# Patient Record
Sex: Female | Born: 1935 | Race: White | Hispanic: No | Smoking: Never smoker
Health system: Southern US, Community
[De-identification: ages and names within clinical notes are randomized; demographics above are authoritative.]

## PROBLEM LIST (undated history)

## (undated) DIAGNOSIS — G919 Hydrocephalus, unspecified: Secondary | ICD-10-CM

## (undated) DIAGNOSIS — G309 Alzheimer's disease, unspecified: Secondary | ICD-10-CM

## (undated) DIAGNOSIS — F0280 Dementia in other diseases classified elsewhere without behavioral disturbance: Secondary | ICD-10-CM

---

## 2007-03-12 ENCOUNTER — Ambulatory Visit (HOSPITAL_COMMUNITY): Admission: RE | Admit: 2007-03-12 | Discharge: 2007-03-12 | Payer: Self-pay | Admitting: Pulmonary Disease

## 2009-06-19 ENCOUNTER — Emergency Department (HOSPITAL_COMMUNITY): Admission: EM | Admit: 2009-06-19 | Discharge: 2009-06-19 | Payer: Self-pay | Admitting: Emergency Medicine

## 2009-06-19 ENCOUNTER — Encounter: Payer: Self-pay | Admitting: Orthopedic Surgery

## 2009-06-23 ENCOUNTER — Ambulatory Visit: Payer: Self-pay | Admitting: Orthopedic Surgery

## 2009-06-23 DIAGNOSIS — S92309A Fracture of unspecified metatarsal bone(s), unspecified foot, initial encounter for closed fracture: Secondary | ICD-10-CM | POA: Insufficient documentation

## 2009-07-25 ENCOUNTER — Ambulatory Visit: Payer: Self-pay | Admitting: Orthopedic Surgery

## 2010-08-15 ENCOUNTER — Encounter: Payer: Self-pay | Admitting: Orthopedic Surgery

## 2010-11-01 ENCOUNTER — Encounter: Payer: Self-pay | Admitting: Orthopedic Surgery

## 2010-11-09 ENCOUNTER — Encounter
Admission: RE | Admit: 2010-11-09 | Discharge: 2010-11-14 | Payer: Self-pay | Source: Home / Self Care | Attending: Endocrinology | Admitting: Endocrinology

## 2010-11-14 NOTE — Letter (Signed)
Summary: [facility] medical record request  [facility] medical record request   Imported By: Jacklynn Ganong 09/14/2010 13:57:24  _____________________________________________________________________  External Attachment:    Type:   Image     Comment:   External Document

## 2010-12-06 NOTE — Letter (Signed)
Summary: Medical record request [facility]  Medical record request [facility]   Imported By: Cammie Sickle 11/28/2010 20:50:29  _____________________________________________________________________  External Attachment:    Type:   Image     Comment:   External Document

## 2012-06-22 ENCOUNTER — Emergency Department (HOSPITAL_COMMUNITY)
Admission: EM | Admit: 2012-06-22 | Discharge: 2012-06-22 | Disposition: A | Discharge status: Home / Self Care | Payer: Medicare Other | Attending: Emergency Medicine | Admitting: Emergency Medicine

## 2012-06-22 ENCOUNTER — Encounter (HOSPITAL_COMMUNITY): Payer: Self-pay | Admitting: *Deleted

## 2012-06-22 ENCOUNTER — Emergency Department (HOSPITAL_COMMUNITY): Payer: Medicare Other

## 2012-06-22 DIAGNOSIS — S9001XA Contusion of right ankle, initial encounter: Secondary | ICD-10-CM

## 2012-06-22 DIAGNOSIS — W19XXXA Unspecified fall, initial encounter: Secondary | ICD-10-CM | POA: Insufficient documentation

## 2012-06-22 DIAGNOSIS — Y9229 Other specified public building as the place of occurrence of the external cause: Secondary | ICD-10-CM | POA: Insufficient documentation

## 2012-06-22 DIAGNOSIS — Z794 Long term (current) use of insulin: Secondary | ICD-10-CM | POA: Insufficient documentation

## 2012-06-22 DIAGNOSIS — S9000XA Contusion of unspecified ankle, initial encounter: Secondary | ICD-10-CM | POA: Insufficient documentation

## 2012-06-22 DIAGNOSIS — S60222A Contusion of left hand, initial encounter: Secondary | ICD-10-CM

## 2012-06-22 DIAGNOSIS — IMO0002 Reserved for concepts with insufficient information to code with codable children: Secondary | ICD-10-CM | POA: Insufficient documentation

## 2012-06-22 DIAGNOSIS — S0001XA Abrasion of scalp, initial encounter: Secondary | ICD-10-CM

## 2012-06-22 DIAGNOSIS — S60229A Contusion of unspecified hand, initial encounter: Secondary | ICD-10-CM | POA: Insufficient documentation

## 2012-06-22 DIAGNOSIS — S0990XA Unspecified injury of head, initial encounter: Secondary | ICD-10-CM | POA: Insufficient documentation

## 2012-06-22 DIAGNOSIS — E119 Type 2 diabetes mellitus without complications: Secondary | ICD-10-CM | POA: Insufficient documentation

## 2012-06-22 LAB — GLUCOSE, CAPILLARY: Glucose-Capillary: 332 mg/dL — ABNORMAL HIGH (ref 70–99)

## 2012-06-22 MED ORDER — ACETAMINOPHEN 500 MG PO TABS
1000.0000 mg | ORAL_TABLET | Freq: Once | ORAL | Status: AC
  Administered 2012-06-22: 1000 mg via ORAL
  Filled 2012-06-22: qty 2

## 2012-06-22 NOTE — ED Provider Notes (Signed)
History    CSN: [id number]  Arrival date & time 06/22/12  1237   First MD Initiated Contact with Patient 06/22/12 1307      Chief Complaint  Patient presents with  . Fall     The history is provided by the patient.   Jillian Hart is a 76 y.o. female who presents to the Emergency Department complaining of backward fall hitting on the pavement. Pt also c/o laceration to the back of the head, bleeding is controlled currently.  She reports she tripped on the church steps and fell backwards.  She's complaining of pain to her right hand in her right ankle.  She reports mild headache for which she requests only Tylenol.  She wants a stronger pain medication.  She denies neck pain.  She has no weakness of her upper lower extremities.  He reports is she's been having some difficulty keeping her blood sugars controlled lately and she is currently working with her primary care physician regarding this.  She reports pain to her Ace of her right thumb as well as her right medial ankle which she states she injured during the fall.  She has no other complaints.  Her pain is mild in severity this time.  Family reports she is at baseline mental status.  She is not on anticoagulants.   Past Medical History  Diagnosis Date  . Diabetes mellitus     History reviewed. No pertinent past surgical history.  No family history on file.  History  Substance Use Topics  . Smoking status: Never Smoker   . Smokeless tobacco: Not on file  . Alcohol Use: No    No OB history provided  Review of Systems  All other systems reviewed and are negative.    A complete 10 system review of systems was obtained and all systems are negative except as noted in the HPI and PMH.   Allergies  Review of patient's allergies indicates no known allergies.  Home Medications   Current Outpatient Rx  Name Route Sig Dispense Refill  . INSULIN ASPART 100 UNIT/ML [place] SOLN Subcutaneous Inject 3 Units into the skin 3 (three)  times daily before meals.    . INSULIN GLARGINE 100 UNIT/ML [place] SOLN Subcutaneous Inject 80-90 Units into the skin at bedtime.    Marland Kitchen LISINOPRIL 10 MG PO TABS Oral Take 10 mg by mouth daily.    Marland Kitchen PAROXETINE HCL 40 MG PO TABS Oral Take 40 mg by mouth at bedtime.    Marland Kitchen ROSUVASTATIN CALCIUM 20 MG PO TABS Oral Take 20 mg by mouth daily.      Triage vitals: BP 156/103  Pulse 82  Temp 98.4 F (36.9 C)  Resp 20  Ht 4\' 10"  (1.473 m)  Wt 122 lb (55.339 kg)  BMI 25.50 kg/m2  SpO2 98%  Physical Exam  Nursing note and vitals reviewed. Constitutional: She is oriented to person, place, and time. She appears well-developed and well-nourished. No distress.  HENT:  Head: Normocephalic and atraumatic.       Small abrasion without laceration but does have associated hematoma to her posterior scalp.  This area is approximately the size of a quarter.  No active bleeding.  Eyes: EOM are normal.  Neck: Normal range of motion.       C-spine nontender.  C-spine cleared by Nexus criteria.  No cervical spine step-offs.  Cardiovascular: Normal rate, regular rhythm and normal heart sounds.   Pulmonary/Chest: Effort normal and breath sounds normal.  Abdominal:  Soft. She exhibits no distension. There is no tenderness.  Musculoskeletal: Normal range of motion.  Neurological: She is alert and oriented to person, place, and time.  Skin: Skin is warm and dry.  Psychiatric: She has a normal mood and affect. Judgment normal.    ED Course  Procedures (including critical care time)  DIAGNOSTIC STUDIES: Oxygen Saturation is 98% on on room air, normal by my interpretation.    COORDINATION OF CARE:    Labs Reviewed  GLUCOSE, CAPILLARY - Abnormal; Notable for the following:    Glucose-Capillary 332 (*)     All other components within normal limits   Dg Ankle Complete Right  06/22/2012  *RADIOLOGY REPORT*  Clinical Data: Fall, right hand and ankle pain  RIGHT ANKLE - COMPLETE 3+ VIEW  Comparison: Prior radiographs  the right ankle 06/19/2009  Findings: No acute fracture, malalignment or ankle joint effusion. The ankle mortise is congruent.  There is mild soft tissue swelling anteriorly.  Healed fracture of the base of the fifth manner tarsal.  IMPRESSION: No acute fracture, malalignment or ankle joint effusion.   Original Report Authenticated By: Threasa Beards Head Wo Contrast  06/22/2012  *RADIOLOGY REPORT*  Clinical Data: 76 year old female status post fall with posterior head injury.  CT HEAD WITHOUT CONTRAST  Technique:  Contiguous axial images were obtained from the base of the skull through the vertex without contrast.  Comparison: None.  Findings: Broad-based posterior superficial scalp hematoma measuring up to 14 mm in thickness.  Underlying calvarium intact.  Mastoids are clear.  Minimal bubbly opacity in the right sphenoid sinus.  Hyperostosis of the calvarium.  Orbit and other scalp soft tissues are within normal limits.  Calcified atherosclerosis at the skull base.  There is ventriculomegaly which seems out of proportion to the degree of volume loss  , but the temporal horns remain relatively diminutive and there is no evidence of transependymal edema. No midline shift, mass effect, or evidence of mass lesion.  No acute intracranial hemorrhage identified.  Evidence of a chronic lacunar infarct in the anterior right thalamus. No evidence of cortically based acute infarction identified.  Gray-white matter differentiation elsewhere within normal limits for age.  IMPRESSION: 1.  Posterior scalp hematoma without underlying fracture. 2.  No acute traumatic injury to the brain. 3.  Ventriculomegaly, nonspecific but in the appropriate clinical setting NPH could not be excluded. 4.  Mild chronic small vessel ischemia suspected.   Original Report Authenticated By: Harley Hallmark, M.D.    Dg Hand Complete Right  06/22/2012  *RADIOLOGY REPORT*  Clinical Data: Fall, right hand and ankle pain  RIGHT HAND - COMPLETE 3+ VIEW   Comparison: None.  Findings: No acute fracture, malalignment or focal soft tissue swelling.  Moderately advanced degenerative osteoarthritis of the from carpometacarpal and interphalangeal joints.  Additionally, there is more mild degenerative osteoarthritic change involving the distal interphalangeal joints of the index, ring and little fingers.  The long finger is relatively spared.  The carpus is intact.  IMPRESSION:  1.  No acute fracture, or malalignment. 2.  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   Original Report Authenticated By: Vilma Prader     I personally reviewed the imaging tests through PACS system I reviewed available ER/hospitalization records thought the EMR    1. Minor head injury   2. Abrasion of scalp   3. Contusion of left hand   4. Contusion of right ankle  MDM  Is a mechanical fall today.  She is hyperglycemic but has no other secondary signs of diabetic ketoacidosis.  Her fall sounds very mechanical.  CT of her head was obtained which demonstrates no fracture.  There was noted to ventriculomegaly have asked that the patient followup with her PCP as well as the neurologist.          Lyanne Co, MD 06/22/12 [phone]

## 2012-06-22 NOTE — ED Notes (Signed)
Pt was coming out of church, fell backwards hitting on the pavement, unsure of what caused her to fall, denies any dizziness, c/o laceration to back of head, bleeding controlled at triage.

## 2012-07-09 ENCOUNTER — Other Ambulatory Visit: Payer: Self-pay | Admitting: Neurosurgery

## 2012-07-09 DIAGNOSIS — R413 Other amnesia: Secondary | ICD-10-CM

## 2012-07-09 DIAGNOSIS — R269 Unspecified abnormalities of gait and mobility: Secondary | ICD-10-CM

## 2012-07-09 DIAGNOSIS — G910 Communicating hydrocephalus: Secondary | ICD-10-CM

## 2012-07-13 ENCOUNTER — Ambulatory Visit
Admission: RE | Admit: 2012-07-13 | Discharge: 2012-07-13 | Disposition: A | Discharge status: Home / Self Care | Payer: Medicare Other | Source: Ambulatory Visit | Attending: Neurosurgery | Admitting: Neurosurgery

## 2012-07-13 DIAGNOSIS — R413 Other amnesia: Secondary | ICD-10-CM

## 2012-07-13 DIAGNOSIS — R269 Unspecified abnormalities of gait and mobility: Secondary | ICD-10-CM

## 2012-07-13 DIAGNOSIS — G910 Communicating hydrocephalus: Secondary | ICD-10-CM

## 2012-07-18 ENCOUNTER — Other Ambulatory Visit: Payer: Self-pay | Admitting: Neurology

## 2012-07-18 DIAGNOSIS — R269 Unspecified abnormalities of gait and mobility: Secondary | ICD-10-CM

## 2012-07-18 DIAGNOSIS — R413 Other amnesia: Secondary | ICD-10-CM

## 2012-07-18 DIAGNOSIS — G910 Communicating hydrocephalus: Secondary | ICD-10-CM

## 2012-07-18 DIAGNOSIS — D649 Anemia, unspecified: Secondary | ICD-10-CM

## 2012-07-22 ENCOUNTER — Ambulatory Visit
Admission: RE | Admit: 2012-07-22 | Discharge: 2012-07-22 | Disposition: A | Discharge status: Home / Self Care | Payer: Medicare Other | Source: Ambulatory Visit | Attending: Neurology | Admitting: Neurology

## 2012-07-22 VITALS — BP 112/64 | HR 77

## 2012-07-22 DIAGNOSIS — R269 Unspecified abnormalities of gait and mobility: Secondary | ICD-10-CM

## 2012-07-22 DIAGNOSIS — G932 Benign intracranial hypertension: Secondary | ICD-10-CM

## 2012-07-22 DIAGNOSIS — R413 Other amnesia: Secondary | ICD-10-CM

## 2012-07-22 DIAGNOSIS — D649 Anemia, unspecified: Secondary | ICD-10-CM

## 2012-07-22 DIAGNOSIS — G910 Communicating hydrocephalus: Secondary | ICD-10-CM

## 2012-07-22 LAB — CSF CELL COUNT WITH DIFFERENTIAL: Tube #: 3

## 2012-07-22 LAB — CRYPTOCOCCAL ANTIGEN, CSF: Crypto Ag: NEGATIVE

## 2012-07-22 LAB — GLUCOSE, CSF: Glucose, CSF: 95 mg/dL — ABNORMAL HIGH (ref 43–76)

## 2012-07-23 LAB — ANGIOTENSIN CONVERTING ENZYME, CSF: ACE, CSF: 6 U/L (ref <=15)

## 2012-07-23 LAB — VDRL, CSF

## 2012-11-02 ENCOUNTER — Emergency Department (HOSPITAL_COMMUNITY): Payer: Medicare Other

## 2012-11-02 ENCOUNTER — Encounter (HOSPITAL_COMMUNITY): Payer: Self-pay | Admitting: *Deleted

## 2012-11-02 ENCOUNTER — Emergency Department (HOSPITAL_COMMUNITY)
Admission: EM | Admit: 2012-11-02 | Discharge: 2012-11-02 | Disposition: A | Discharge status: Home / Self Care | Payer: Medicare Other | Attending: Emergency Medicine | Admitting: Emergency Medicine

## 2012-11-02 DIAGNOSIS — J3489 Other specified disorders of nose and nasal sinuses: Secondary | ICD-10-CM | POA: Insufficient documentation

## 2012-11-02 DIAGNOSIS — R05 Cough: Secondary | ICD-10-CM | POA: Insufficient documentation

## 2012-11-02 DIAGNOSIS — Z794 Long term (current) use of insulin: Secondary | ICD-10-CM | POA: Insufficient documentation

## 2012-11-02 DIAGNOSIS — E119 Type 2 diabetes mellitus without complications: Secondary | ICD-10-CM | POA: Insufficient documentation

## 2012-11-02 DIAGNOSIS — J029 Acute pharyngitis, unspecified: Secondary | ICD-10-CM | POA: Insufficient documentation

## 2012-11-02 DIAGNOSIS — R51 Headache: Secondary | ICD-10-CM | POA: Insufficient documentation

## 2012-11-02 DIAGNOSIS — Z8669 Personal history of other diseases of the nervous system and sense organs: Secondary | ICD-10-CM | POA: Insufficient documentation

## 2012-11-02 DIAGNOSIS — R059 Cough, unspecified: Secondary | ICD-10-CM

## 2012-11-02 DIAGNOSIS — J069 Acute upper respiratory infection, unspecified: Secondary | ICD-10-CM

## 2012-11-02 DIAGNOSIS — Z79899 Other long term (current) drug therapy: Secondary | ICD-10-CM | POA: Insufficient documentation

## 2012-11-02 HISTORY — DX: Hydrocephalus, unspecified: G91.9

## 2012-11-02 MED ORDER — BENZONATATE 200 MG PO CAPS: 200.0000 mg | ORAL_CAPSULE | Freq: Three times a day (TID) | ORAL | Status: DC | PRN

## 2012-11-02 MED ORDER — AZITHROMYCIN 250 MG PO TABS: ORAL_TABLET | ORAL | Status: DC

## 2012-11-02 NOTE — ED Notes (Signed)
Headache, sore throat, cough, congestion x 1 wk.

## 2012-11-03 NOTE — ED Provider Notes (Signed)
History     CSN: [id number]  Arrival date & time 11/02/12  7829   First MD Initiated Contact with Patient 11/02/12 1018      Chief Complaint  Patient presents with  . Cough  . Sore Throat    (Consider location/radiation/quality/duration/timing/severity/associated sxs/prior treatment) HPI Comments: patient c/o nasal congestion frontal headache, cough and sore throat for one week.  States she has been taking tylenol and ibuprofen w/o improvement.  States the headache has been gradual in onset and cough is mostly non-productive.  She denies fever, chest pain, neck pain or stiffness, shortness of breath or vomiting.  Cough is mostly worse at night.  Patient is a 77 y.o. female presenting with URI. The history is provided by the patient.  URI The primary symptoms include headaches, sore throat and cough. Primary symptoms do not include fever, ear pain, swollen glands, wheezing, abdominal pain, nausea, vomiting, myalgias, arthralgias or rash. Primary symptoms comment: nasal congestion The current episode started 6 to 7 days ago. This is a new problem. The problem has not changed since onset. The headache began more than 2 days ago. The headache developed gradually. Headache is a new problem. The headache is present intermittently. The headache is not associated with aura, photophobia, double vision, eye pain, decreased vision, stiff neck, neck stiffness, weakness or loss of balance.   The sore throat began more than 2 days ago. The sore throat has been unchanged since its onset. The sore throat is mild in intensity. Pain radiation: does not radiate. The sore throat is not accompanied by trouble swallowing, drooling, hoarse voice or stridor.  The cough began 6 to 7 days ago. The cough is new. The cough is productive. The sputum is clear.  Symptoms associated with the illness include congestion and rhinorrhea. The illness is not associated with chills, facial pain or sinus pressure.    Past  Medical History  Diagnosis Date  . Diabetes mellitus   . Hydrocephalus     History reviewed. No pertinent past surgical history.  No family history on file.  History  Substance Use Topics  . Smoking status: Never Smoker   . Smokeless tobacco: Not on file  . Alcohol Use: No    OB History    Grav Para Term Preterm Abortions TAB SAB Ect Mult Living                  Review of Systems  Constitutional: Negative for fever, chills, activity change and appetite change.  HENT: Positive for congestion, sore throat and rhinorrhea. Negative for ear pain, hoarse voice, facial swelling, drooling, trouble swallowing, neck pain, neck stiffness and sinus pressure.   Eyes: Negative for double vision, photophobia, pain and visual disturbance.  Respiratory: Positive for cough. Negative for shortness of breath, wheezing and stridor.   Gastrointestinal: Negative for nausea, vomiting and abdominal pain.  Genitourinary: Negative for dysuria, urgency, flank pain and difficulty urinating.  Musculoskeletal: Negative for myalgias and arthralgias.  Skin: Negative.  Negative for rash and wound.  Neurological: Positive for headaches. Negative for dizziness, weakness, numbness and loss of balance.  Hematological: Negative for adenopathy.  Psychiatric/Behavioral: Negative for confusion and decreased concentration.  All other systems reviewed and are negative.    Allergies  Review of patient's allergies indicates no known allergies.  Home Medications   Current Outpatient Rx  Name  Route  Sig  Dispense  Refill  . ACETAMINOPHEN 325 MG PO TABS   Oral   Take 325 mg by mouth  every 6 (six) hours as needed. Pain.         Marland Kitchen CETIRIZINE HCL 10 MG PO TABS   Oral   Take 10 mg by mouth daily.         . INSULIN GLARGINE 100 UNIT/ML [place] SOLN   Subcutaneous   Inject 100 Units into the skin at bedtime.          Marland Kitchen LISINOPRIL 10 MG PO TABS   Oral   Take 10 mg by mouth daily.         Marland Kitchen PAROXETINE HCL  40 MG PO TABS   Oral   Take 40 mg by mouth at bedtime.         Marland Kitchen ROSUVASTATIN CALCIUM 20 MG PO TABS   Oral   Take 20 mg by mouth daily.         . AZITHROMYCIN 250 MG PO TABS      Take two tablets on day one, then one tab qd days 2-5   6 tablet   0   . BENZONATATE 200 MG PO CAPS   Oral   Take 1 capsule (200 mg total) by mouth 3 (three) times daily as needed for cough.   21 capsule   0     BP 115/67  Pulse 96  Temp 97.7 F (36.5 C) (Oral)  Resp 16  Ht 4\' 9"  (1.448 m)  Wt 115 lb (52.164 kg)  BMI 24.89 kg/m2  SpO2 97%  Physical Exam  Nursing note and vitals reviewed. Constitutional: She is oriented to person, place, and time. She appears well-developed and well-nourished. No distress.  HENT:  Head: Normocephalic and atraumatic. No trismus in the jaw.  Right Ear: Tympanic membrane and ear canal normal.  Left Ear: Tympanic membrane and ear canal normal.  Nose: Mucosal edema and rhinorrhea present. Right sinus exhibits frontal sinus tenderness. Right sinus exhibits no maxillary sinus tenderness. Left sinus exhibits frontal sinus tenderness. Left sinus exhibits no maxillary sinus tenderness.  Mouth/Throat: Uvula is midline and mucous membranes are normal. No uvula swelling. Posterior oropharyngeal erythema present. No oropharyngeal exudate, posterior oropharyngeal edema or tonsillar abscesses.  Neck: Normal range of motion and phonation normal. Neck supple. No Brudzinski's sign and no Kernig's sign noted.  Cardiovascular: Normal rate, regular rhythm, normal heart sounds and intact distal pulses.   No murmur heard. Pulmonary/Chest: Effort normal and breath sounds normal. No respiratory distress. She has no wheezes. She has no rales.  Abdominal: Soft. Bowel sounds are normal.  Musculoskeletal: She exhibits no edema.  Lymphadenopathy:    She has no cervical adenopathy.  Neurological: She is alert and oriented to person, place, and time. She exhibits normal muscle tone.  Coordination normal.  Skin: Skin is warm and dry.    ED Course  Procedures (including critical care time)  Results for orders placed during the hospital encounter of 11/02/12  RAPID STREP SCREEN      Component Value Range   Streptococcus, Group A Screen (Direct) NEGATIVE  NEGATIVE    Dg Chest 2 View  11/02/2012  *RADIOLOGY REPORT*  Clinical Data: Cough and congestion  CHEST - 2 VIEW  Comparison: None.  Findings: Mild right hemidiaphragm elevation. Midline trachea. Borderline cardiomegaly.  Atherosclerosis in the transverse aorta. No pleural effusion or pneumothorax.  Mild volume loss at the left lung base.  IMPRESSION: No acute cardiopulmonary disease.   Original Report Authenticated By: Jeronimo Greaves, M.D.      1. Cough   2. URI (upper respiratory  infection)       MDM   Patient is well appearing, NAD. Vitals stable.  No tachycardia, tachypnea or hypoxia.  No PNA on CXR.    Will treat with tessalon and Z-pack.  Pt hx discussed with EDP.  Patient agrees to close f/u with PMD.        Noreen Mackintosh L. [place], [place] 11/03/12 2006

## 2012-11-04 NOTE — ED Provider Notes (Signed)
Medical screening examination/treatment/procedure(s) were performed by non-physician practitioner and as supervising physician I was immediately available for consultation/collaboration.   Yarixa Lightcap W Mohammad Granade, MD 11/04/12 1616 

## 2013-02-06 DIAGNOSIS — Z0289 Encounter for other administrative examinations: Secondary | ICD-10-CM

## 2013-02-25 ENCOUNTER — Emergency Department (HOSPITAL_COMMUNITY): Payer: Medicare Other

## 2013-02-25 ENCOUNTER — Encounter (HOSPITAL_COMMUNITY): Payer: Self-pay | Admitting: Emergency Medicine

## 2013-02-25 ENCOUNTER — Emergency Department (HOSPITAL_COMMUNITY)
Admission: EM | Admit: 2013-02-25 | Discharge: 2013-02-25 | Disposition: A | Discharge status: Home / Self Care | Payer: Medicare Other | Attending: Emergency Medicine | Admitting: Emergency Medicine

## 2013-02-25 DIAGNOSIS — G911 Obstructive hydrocephalus: Secondary | ICD-10-CM | POA: Insufficient documentation

## 2013-02-25 DIAGNOSIS — E119 Type 2 diabetes mellitus without complications: Secondary | ICD-10-CM | POA: Insufficient documentation

## 2013-02-25 DIAGNOSIS — T148XXA Other injury of unspecified body region, initial encounter: Secondary | ICD-10-CM

## 2013-02-25 DIAGNOSIS — Z794 Long term (current) use of insulin: Secondary | ICD-10-CM | POA: Insufficient documentation

## 2013-02-25 DIAGNOSIS — Y9301 Activity, walking, marching and hiking: Secondary | ICD-10-CM | POA: Insufficient documentation

## 2013-02-25 DIAGNOSIS — Z79899 Other long term (current) drug therapy: Secondary | ICD-10-CM | POA: Insufficient documentation

## 2013-02-25 DIAGNOSIS — S7000XA Contusion of unspecified hip, initial encounter: Secondary | ICD-10-CM | POA: Insufficient documentation

## 2013-02-25 DIAGNOSIS — W010XXA Fall on same level from slipping, tripping and stumbling without subsequent striking against object, initial encounter: Secondary | ICD-10-CM | POA: Insufficient documentation

## 2013-02-25 DIAGNOSIS — Y92009 Unspecified place in unspecified non-institutional (private) residence as the place of occurrence of the external cause: Secondary | ICD-10-CM | POA: Insufficient documentation

## 2013-02-25 MED ORDER — TRAMADOL HCL 50 MG PO TABS: 50.0000 mg | ORAL_TABLET | Freq: Four times a day (QID) | ORAL | Status: AC | PRN

## 2013-02-25 NOTE — ED Notes (Signed)
Discharge instructions given and reviewed with patient.  Prescription given for Tramadol; effects and use explained.  Patient verbalized understanding to rest her hip for the next few days and to follow up with PMD as needed.  Patient discharged home in good condition.

## 2013-02-25 NOTE — ED Provider Notes (Signed)
History     CSN: [id number]  Arrival date & time 02/25/13  1946   First MD Initiated Contact with Patient 02/25/13 1953      Chief Complaint  Patient presents with  . Fall  . Hip Pain    (Consider location/radiation/quality/duration/timing/severity/associated sxs/prior treatment) HPI Comments: Patient comes to the ER for evaluation after a fall. Patient reports that she walked up several steps onto a porch and lost her balance. She fell onto her left side, did not fall down the steps. She landed on the porch. She was able to get up and ambulate after the fall. She is complaining of pain in the lateral hip that is moderate and worse with movement. No head injury. No neck or back pain.  Patient is a 77 y.o. female presenting with fall and hip pain.  Fall  Hip Pain    Past Medical History  Diagnosis Date  . Diabetes mellitus   . Hydrocephalus     History reviewed. No pertinent past surgical history.  History reviewed. No pertinent family history.  History  Substance Use Topics  . Smoking status: Never Smoker   . Smokeless tobacco: Not on file  . Alcohol Use: No    OB History   Grav Para Term Preterm Abortions TAB SAB Ect Mult Living                  Review of Systems  HENT: Negative for neck pain.   Musculoskeletal: Negative for back pain.    Allergies  Review of patient's allergies indicates no known allergies.  Home Medications   Current Outpatient Rx  Name  Route  Sig  Dispense  Refill  . acetaminophen (TYLENOL) 325 MG tablet   Oral   Take 325 mg by mouth every 6 (six) hours as needed. Pain.         Marland Kitchen azithromycin (ZITHROMAX Z-PAK) 250 MG tablet      Take two tablets on day one, then one tab qd days 2-5   6 tablet   0   . benzonatate (TESSALON) 200 MG capsule   Oral   Take 1 capsule (200 mg total) by mouth 3 (three) times daily as needed for cough.   21 capsule   0   . cetirizine (ZYRTEC) 10 MG tablet   Oral   Take 10 mg by mouth  daily.         . insulin glargine (LANTUS) 100 UNIT/ML injection   Subcutaneous   Inject 100 Units into the skin at bedtime.          Marland Kitchen lisinopril (PRINIVIL,ZESTRIL) 10 MG tablet   Oral   Take 10 mg by mouth daily.         Marland Kitchen PARoxetine (PAXIL) 40 MG tablet   Oral   Take 40 mg by mouth at bedtime.         . rosuvastatin (CRESTOR) 20 MG tablet   Oral   Take 20 mg by mouth daily.           BP 138/74  Pulse 95  Temp(Src) 97.1 F (36.2 C) (Oral)  Resp 16  Ht 4\' 10"  (1.473 m)  Wt 112 lb (50.803 kg)  BMI 23.41 kg/m2  SpO2 100%  Physical Exam  Constitutional: She is oriented to person, place, and time. She appears well-developed and well-nourished. No distress.  HENT:  Head: Normocephalic and atraumatic.  Right Ear: Hearing normal.  Left Ear: Hearing normal.  Nose: Nose normal.  Mouth/Throat: Oropharynx  is clear and moist and mucous membranes are normal.  Eyes: Conjunctivae and EOM are normal. Pupils are equal, round, and reactive to light.  Neck: Normal range of motion. Neck supple.  Cardiovascular: Regular rhythm, S1 normal and S2 normal.  Exam reveals no gallop and no friction rub.   No murmur heard. Pulmonary/Chest: Effort normal and breath sounds normal. No respiratory distress. She exhibits no tenderness.  Abdominal: Soft. Normal appearance and bowel sounds are normal. There is no hepatosplenomegaly. There is no tenderness. There is no rebound, no guarding, no tenderness at McBurney's point and negative Murphy's sign. No hernia.  Musculoskeletal: Normal range of motion.       Left hip: She exhibits tenderness. She exhibits normal range of motion, normal strength and no deformity.       Legs: Neurological: She is alert and oriented to person, place, and time. She has normal strength. No cranial nerve deficit or sensory deficit. Coordination normal. GCS eye subscore is 4. GCS verbal subscore is 5. GCS motor subscore is 6.  Skin: Skin is warm, dry and intact. No  rash noted. No cyanosis.  Psychiatric: She has a normal mood and affect. Her speech is normal and behavior is normal. Thought content normal.    ED Course  Procedures (including critical care time)  Labs Reviewed - No data to display Dg Hip Complete Left  02/25/2013   *RADIOLOGY REPORT*  Clinical Data: Fall and left hip pain.  LEFT HIP - COMPLETE 2+ VIEW  Comparison: None.  Findings: Pelvic bony ring is intact.  Large amount of stool in the left lower quadrant.  Left hip is located without acute fracture. Degenerative changes in the lower lumbar spine.  IMPRESSION: No acute bony abnormality.  If there is high clinical concern for an occult hip fracture, recommend further characterization with CT or MRI.   Original Report Authenticated By: Richarda Overlie, M.D.     Diagnosis: Contusion    MDM  Presents with complaints of pain in the lateral aspect of the hip after a ground-level fall. There is no other injury. Patient is ambulatory. There is low suspicion for hip fracture, x-ray obtained to look for fracture as well as pelvic fracture. X-ray was unremarkable. Patient reassured, rest and pain control.        Gilda Crease, MD 02/25/13 2100

## 2013-02-25 NOTE — ED Notes (Signed)
Pt states she fell going down the steps and c/o left hip pain.

## 2014-06-08 ENCOUNTER — Ambulatory Visit: Payer: Medicare Other | Admitting: Physical Therapy

## 2014-11-29 ENCOUNTER — Encounter (HOSPITAL_COMMUNITY): Payer: Self-pay | Admitting: Emergency Medicine

## 2014-11-29 ENCOUNTER — Emergency Department (HOSPITAL_COMMUNITY): Payer: Medicare Other

## 2014-11-29 ENCOUNTER — Emergency Department (HOSPITAL_COMMUNITY)
Admission: EM | Admit: 2014-11-29 | Discharge: 2014-11-29 | Disposition: A | Discharge status: Home / Self Care | Payer: Medicare Other | Attending: Emergency Medicine | Admitting: Emergency Medicine

## 2014-11-29 DIAGNOSIS — E119 Type 2 diabetes mellitus without complications: Secondary | ICD-10-CM | POA: Diagnosis not present

## 2014-11-29 DIAGNOSIS — Z8669 Personal history of other diseases of the nervous system and sense organs: Secondary | ICD-10-CM | POA: Diagnosis not present

## 2014-11-29 DIAGNOSIS — E876 Hypokalemia: Secondary | ICD-10-CM | POA: Insufficient documentation

## 2014-11-29 DIAGNOSIS — R410 Disorientation, unspecified: Secondary | ICD-10-CM | POA: Diagnosis not present

## 2014-11-29 DIAGNOSIS — R110 Nausea: Secondary | ICD-10-CM | POA: Diagnosis not present

## 2014-11-29 DIAGNOSIS — Z794 Long term (current) use of insulin: Secondary | ICD-10-CM | POA: Diagnosis not present

## 2014-11-29 DIAGNOSIS — Z7982 Long term (current) use of aspirin: Secondary | ICD-10-CM | POA: Diagnosis not present

## 2014-11-29 DIAGNOSIS — G309 Alzheimer's disease, unspecified: Secondary | ICD-10-CM | POA: Diagnosis not present

## 2014-11-29 DIAGNOSIS — Z79899 Other long term (current) drug therapy: Secondary | ICD-10-CM | POA: Diagnosis not present

## 2014-11-29 HISTORY — DX: Dementia in other diseases classified elsewhere, unspecified severity, without behavioral disturbance, psychotic disturbance, mood disturbance, and anxiety: F02.80

## 2014-11-29 HISTORY — DX: Alzheimer's disease, unspecified: G30.9

## 2014-11-29 LAB — URINALYSIS, ROUTINE W REFLEX MICROSCOPIC
Bilirubin Urine: NEGATIVE
Glucose, UA: NEGATIVE mg/dL
Ketones, ur: NEGATIVE mg/dL
Nitrite: NEGATIVE
Specific Gravity, Urine: 1.01 (ref 1.005–1.030)
Urobilinogen, UA: 0.2 mg/dL (ref 0.0–1.0)
pH: 6 (ref 5.0–8.0)

## 2014-11-29 LAB — COMPREHENSIVE METABOLIC PANEL
ALT: 15 U/L (ref 0–35)
ANION GAP: 3 — AB (ref 5–15)
AST: 23 U/L (ref 0–37)
Albumin: 3.6 g/dL (ref 3.5–5.2)
Alkaline Phosphatase: 110 U/L (ref 39–117)
BUN: 13 mg/dL (ref 6–23)
CO2: 25 mmol/L (ref 19–32)
CREATININE: 1.21 mg/dL — AB (ref 0.50–1.10)
Calcium: 9.1 mg/dL (ref 8.4–10.5)
Chloride: 108 mmol/L (ref 96–112)
GFR calc Af Amer: 49 mL/min — ABNORMAL LOW (ref >90)
GFR, EST NON AFRICAN AMERICAN: 42 mL/min — AB (ref >90)
Glucose, Bld: 187 mg/dL — ABNORMAL HIGH (ref 70–99)
POTASSIUM: 2.8 mmol/L — AB (ref 3.5–5.1)
SODIUM: 136 mmol/L (ref 135–145)
Total Bilirubin: 0.7 mg/dL (ref 0.3–1.2)
Total Protein: 6.9 g/dL (ref 6.0–8.3)

## 2014-11-29 LAB — CBC WITH DIFFERENTIAL/PLATELET
BASOS ABS: 0 10*3/uL (ref 0.0–0.1)
Basophils Relative: 0 % (ref 0–1)
EOS ABS: 0.3 10*3/uL (ref 0.0–0.7)
Eosinophils Relative: 4 % (ref 0–5)
HEMATOCRIT: 37.4 % (ref 36.0–46.0)
HEMOGLOBIN: 12.4 g/dL (ref 12.0–15.0)
LYMPHS PCT: 15 % (ref 12–46)
Lymphs Abs: 1.2 10*3/uL (ref 0.7–4.0)
MCH: 26.6 pg (ref 26.0–34.0)
MCHC: 33.2 g/dL (ref 30.0–36.0)
MCV: 80.3 fL (ref 78.0–100.0)
MONOS PCT: 8 % (ref 3–12)
Monocytes Absolute: 0.6 10*3/uL (ref 0.1–1.0)
Neutro Abs: 6 10*3/uL (ref 1.7–7.7)
Neutrophils Relative %: 73 % (ref 43–77)
Platelets: 161 10*3/uL (ref 150–400)
RBC: 4.66 MIL/uL (ref 3.87–5.11)
RDW: 15.1 % (ref 11.5–15.5)
WBC: 8.1 10*3/uL (ref 4.0–10.5)

## 2014-11-29 LAB — TROPONIN I: Troponin I: <0.03 ng/mL (ref <0.031)

## 2014-11-29 LAB — URINE MICROSCOPIC-ADD ON

## 2014-11-29 LAB — LIPASE, BLOOD: Lipase: 27 U/L (ref 11–59)

## 2014-11-29 LAB — I-STAT CG4 LACTIC ACID, ED: LACTIC ACID, VENOUS: 1.45 mmol/L (ref 0.5–2.0)

## 2014-11-29 LAB — CBG MONITORING, ED: Glucose-Capillary: 158 mg/dL — ABNORMAL HIGH (ref 70–99)

## 2014-11-29 MED ORDER — ONDANSETRON HCL 4 MG PO TABS: 4.0000 mg | ORAL_TABLET | Freq: Four times a day (QID) | ORAL | Status: AC

## 2014-11-29 MED ORDER — SODIUM CHLORIDE 0.9 % IV SOLN
Freq: Once | INTRAVENOUS | Status: AC
  Administered 2014-11-29: 14:00:00 via INTRAVENOUS

## 2014-11-29 MED ORDER — ONDANSETRON HCL 4 MG/2ML IJ SOLN
4.0000 mg | Freq: Once | INTRAMUSCULAR | Status: AC
  Administered 2014-11-29: 4 mg via INTRAVENOUS
  Filled 2014-11-29: qty 2

## 2014-11-29 MED ORDER — POTASSIUM CHLORIDE CRYS ER 20 MEQ PO TBCR
40.0000 meq | EXTENDED_RELEASE_TABLET | Freq: Once | ORAL | Status: AC
  Administered 2014-11-29: 40 meq via ORAL
  Filled 2014-11-29: qty 2

## 2014-11-29 MED ORDER — POTASSIUM CHLORIDE CRYS ER 20 MEQ PO TBCR: 20.0000 meq | EXTENDED_RELEASE_TABLET | Freq: Every day | ORAL | Status: AC

## 2014-11-29 NOTE — Discharge Instructions (Signed)
Confusion Confusion is the inability to think with your usual speed or clarity. Confusion may come on quickly or slowly over time. How quickly the confusion comes on depends on the cause. Confusion can be due to any number of causes. CAUSES   Concussion, head injury, or head trauma.  Seizures.  Stroke.  Fever.  Brain tumor.  Age related decreased brain function (dementia).  Heightened emotional states like rage or terror.  Mental illness in which the person loses the ability to determine what is real and what is not (hallucinations).  Infections such as a urinary tract infection (UTI).  Toxic effects from alcohol, drugs, or prescription medicines.  Dehydration and an imbalance of salts in the body (electrolytes).  Lack of sleep.  Low blood sugar (diabetes).  Low levels of oxygen from conditions such as chronic lung disorders.  Drug interactions or other medicine side effects.  Nutritional deficiencies, especially niacin, thiamine, vitamin C, or vitamin B.  Sudden drop in body temperature (hypothermia).  Change in routine, such as when traveling or hospitalized. SIGNS AND SYMPTOMS  People often describe their thinking as cloudy or unclear when they are confused. Confusion can also include feeling disoriented. That means you are unaware of where or who you are. You may also not know what the date or time is. If confused, you may also have difficulty paying attention, remembering, and making decisions. Some people also act aggressively when they are confused.  DIAGNOSIS  The medical evaluation of confusion may include:  Blood and urine tests.  X-rays.  Brain and nervous system tests.  Analyzing your brain waves (electroencephalogram or EEG).  Magnetic resonance imaging (MRI) of your head.  Computed tomography (CT) scan of your head.  Mental status tests in which your health care provider may ask many questions. Some of these questions may seem silly or strange,  but they are a very important test to help diagnose and treat confusion. TREATMENT  An admission to the hospital may not be needed, but a person with confusion should not be left alone. Stay with a family member or friend until the confusion clears. Avoid alcohol, pain relievers, or sedative drugs until you have fully recovered. Do not drive until directed by your health care provider. HOME CARE INSTRUCTIONS  What family and friends can do:  To find out if someone is confused, ask the person to state his or her name, age, and the date. If the person is unsure or answers incorrectly, he or she is confused.  Always introduce yourself, no matter how well the person knows you.  Often remind the person of his or her location.  Place a calendar and clock near the confused person.  Help the person with his or her medicines. You may want to use a pill box, an alarm as a reminder, or give the person each dose as prescribed.  Talk about current events and plans for the day.  Try to keep the environment calm, quiet, and peaceful.  Make sure the person keeps follow-up visits with his or her health care provider. PREVENTION  Ways to prevent confusion:  Avoid alcohol.  Eat a balanced diet.  Get enough sleep.  Take medicine only as directed by your health care provider.  Do not become isolated. Spend time with other people and make plans for your days.  Keep careful watch on your blood sugar levels if you are diabetic. SEEK IMMEDIATE MEDICAL CARE IF:   You develop severe headaches, repeated vomiting, seizures, blackouts, or  slurred speech.  There is increasing confusion, weakness, numbness, restlessness, or personality changes.  You develop a loss of balance, have marked dizziness, feel uncoordinated, or fall.  You have delusions, hallucinations, or develop severe anxiety.  Your family members think you need to be rechecked. Document Released: 11/08/2004 Document Revised: 02/15/2014  Document Reviewed: 11/06/2013 [facility] Patient Information 2015 [place], [place]. This information is not intended to replace advice given to you by your health care provider. Make sure you discuss any questions you have with your health care provider.  Hypokalemia Hypokalemia means that the amount of potassium in the blood is lower than normal.Potassium is a chemical, called an electrolyte, that helps regulate the amount of fluid in the body. It also stimulates muscle contraction and helps nerves function properly.Most of the body's potassium is inside of cells, and only a very small amount is in the blood. Because the amount in the blood is so small, minor changes can be life-threatening. CAUSES  Antibiotics.  Diarrhea or vomiting.  Using laxatives too much, which can cause diarrhea.  Chronic kidney disease.  Water pills (diuretics).  Eating disorders (bulimia).  Low magnesium level.  Sweating a lot. SIGNS AND SYMPTOMS  Weakness.  Constipation.  Fatigue.  Muscle cramps.  Mental confusion.  Skipped heartbeats or irregular heartbeat (palpitations).  Tingling or numbness. DIAGNOSIS  Your health care provider can diagnose hypokalemia with blood tests. In addition to checking your potassium level, your health care provider may also check other lab tests. TREATMENT Hypokalemia can be treated with potassium supplements taken by mouth or adjustments in your current medicines. If your potassium level is very low, you may need to get potassium through a vein (IV) and be monitored in the hospital. A diet high in potassium is also helpful. Foods high in potassium are:  Nuts, such as peanuts and pistachios.  Seeds, such as sunflower seeds and pumpkin seeds.  Peas, lentils, and lima beans.  Whole grain and bran cereals and breads.  Fresh fruit and vegetables, such as apricots, avocado, bananas, cantaloupe, kiwi, oranges, tomatoes, asparagus, and potatoes.  Orange and tomato  juices.  Red meats.  Fruit yogurt. HOME CARE INSTRUCTIONS  Take all medicines as prescribed by your health care provider.  Maintain a healthy diet by including nutritious food, such as fruits, vegetables, nuts, whole grains, and lean meats.  If you are taking a laxative, be sure to follow the directions on the label. SEEK MEDICAL CARE IF:  Your weakness gets worse.  You feel your heart pounding or racing.  You are vomiting or having diarrhea.  You are diabetic and having trouble keeping your blood glucose in the normal range. SEEK IMMEDIATE MEDICAL CARE IF:  You have chest pain, shortness of breath, or dizziness.  You are vomiting or having diarrhea for more than 2 days.  You faint. MAKE SURE YOU:   Understand these instructions.  Will watch your condition.  Will get help right away if you are not doing well or get worse. Document Released: 10/01/2005 Document Revised: 07/22/2013 Document Reviewed: 04/03/2013 [facility] Patient Information 2015 [place], [place]. This information is not intended to replace advice given to you by your health care provider. Make sure you discuss any questions you have with your health care provider.  Nausea, Adult Nausea is the feeling that you have an upset stomach or have to vomit. Nausea by itself is not likely a serious concern, but it may be an early sign of more serious medical problems. As nausea gets worse,  it can lead to vomiting. If vomiting develops, there is the risk of dehydration.  CAUSES   Viral infections.  Food poisoning.  Medicines.  Pregnancy.  Motion sickness.  Migraine headaches.  Emotional distress.  Severe pain from any source.  Alcohol intoxication. HOME CARE INSTRUCTIONS  Get plenty of rest.  Ask your caregiver about specific rehydration instructions.  Eat small amounts of food and sip liquids more often.  Take all medicines as told by your caregiver. SEEK MEDICAL CARE IF:  You have not improved  after 2 days, or you get worse.  You have a headache. SEEK IMMEDIATE MEDICAL CARE IF:   You have a fever.  You faint.  You keep vomiting or have blood in your vomit.  You are extremely weak or dehydrated.  You have dark or bloody stools.  You have severe chest or abdominal pain. MAKE SURE YOU:  Understand these instructions.  Will watch your condition.  Will get help right away if you are not doing well or get worse. Document Released: 11/08/2004 Document Revised: 06/25/2012 Document Reviewed: 06/13/2011 [facility] Patient Information 2015 [place], [place]. This information is not intended to replace advice given to you by your health care provider. Make sure you discuss any questions you have with your health care provider.

## 2014-11-29 NOTE — ED Notes (Signed)
Patient c/o nausea. Patient denies any vomiting, diarrhea, urinary symptoms, fevers, or abd pain. Per patient visited brother in nursing home recently and he was vomiting. Per daughter patient has Alzheimer's and fluid on brain but in past week patient has become more disoriented and agitated then normal. Daughter reports that patient's medication has recently been changed from Paxil to Wellbutrin possibly causing nausea. Daughter states that patient did go see brother in nursing home but he was not sick. PCP wanted to check patient for UTI but patient not able to give urine sample at office.

## 2014-11-29 NOTE — ED Provider Notes (Signed)
CSN: [id number]     Arrival date & time 11/29/14  1335 History   First MD Initiated Contact with Patient 11/29/14 1346     Chief Complaint  Patient presents with  . Nausea     (Consider location/radiation/quality/duration/timing/severity/associated sxs/prior Treatment) HPI Comments: Patient presents to ER for evaluation of nausea. Patient has not had any vomiting and she denies abdominal pain and diarrhea. She just recently visited her brother in a nursing home and EOM is sick with nausea and vomiting.  Experiencing any chest pain or shortness of breath. She has not had any fever. She denies urinary symptoms.  Daughter also concerned because patient has been seemingly more disoriented and confused in the last week or so. She does have a history of Alzheimer's dementia as well as hydrocephalus status post head injury.   Past Medical History  Diagnosis Date  . Diabetes mellitus   . Hydrocephalus   . Alzheimer disease    History reviewed. No pertinent past surgical history. Family History  Problem Relation Age of Onset  . Cancer Mother   . Stroke Father    History  Substance Use Topics  . Smoking status: Never Smoker   . Smokeless tobacco: Never Used  . Alcohol Use: No   OB History    Gravida Para Term Preterm AB TAB SAB Ectopic Multiple Living   Review of Systems  Constitutional: Negative for fever.  Gastrointestinal: Positive for nausea. Negative for vomiting, abdominal pain and diarrhea.  Psychiatric/Behavioral: Positive for confusion.  All other systems reviewed and are negative.     Allergies  Review of patient's allergies indicates no known allergies.  Home Medications   Prior to Admission medications   Medication Sig Start Date End Date Taking? Authorizing Provider  acetaminophen (TYLENOL) 325 MG tablet Take 325 mg by mouth every 6 (six) hours as needed. Pain.   Yes Historical Provider, MD  aspirin EC 81 MG tablet Take 81 mg by mouth  every morning.   Yes Historical Provider, MD  buPROPion (WELLBUTRIN XL) 150 MG 24 hr tablet Take 150 mg by mouth every morning.   Yes Historical Provider, MD  insulin detemir (LEVEMIR) 100 UNIT/ML injection Inject 50 Units into the skin every morning.   Yes Historical Provider, MD  insulin lispro (HUMALOG) 100 UNIT/ML KiwkPen Inject 20 Units into the skin 2 (two) times daily.   Yes Historical Provider, MD  rosuvastatin (CRESTOR) 20 MG tablet Take 20 mg by mouth every morning.   Yes Historical Provider, MD  ondansetron (ZOFRAN) 4 MG tablet Take 1 tablet (4 mg total) by mouth every 6 (six) hours. 11/29/14   Gilda Crease, MD  potassium chloride SA (K-DUR,KLOR-CON) 20 MEQ tablet Take 1 tablet (20 mEq total) by mouth daily. 11/29/14   Gilda Crease, MD  traMADol (ULTRAM) 50 MG tablet Take 1 tablet (50 mg total) by mouth every 6 (six) hours as needed for pain. Patient not taking: Reported on 11/29/2014 02/25/13   Gilda Crease, MD   BP 145/81 mmHg  Pulse 98  Temp(Src) 97.8 F (36.6 C) (Oral)  Resp 14  Ht  (1.473 m)  Wt 118 lb (53.524 kg)  BMI 24.67 kg/m2  SpO2 100% Physical Exam  Constitutional: She is oriented to person, place, and time. She appears well-developed and well-nourished. No distress.  HENT:  Head: Normocephalic and atraumatic.  Right Ear: Hearing normal.  Left Ear: Hearing normal.  Nose: Nose normal.  Mouth/Throat: Oropharynx is clear and moist and mucous membranes are normal.  Eyes: Conjunctivae and EOM are normal. Pupils are equal, round, and reactive to light.  Neck: Normal range of motion. Neck supple.  Cardiovascular: Regular rhythm, S1 normal and S2 normal.  Exam reveals no gallop and no friction rub.   No murmur heard. Pulmonary/Chest: Effort normal and breath sounds normal. No respiratory distress. She exhibits no tenderness.  Abdominal: Soft. Normal appearance and bowel sounds are normal. There is no hepatosplenomegaly. There is no  tenderness. There is no rebound, no guarding, no tenderness at McBurney's point and negative Murphy's sign. No hernia.  Musculoskeletal: Normal range of motion.  Neurological: She is alert and oriented to person, place, and time. She has normal strength. No cranial nerve deficit or sensory deficit. Coordination normal. GCS eye subscore is 4. GCS verbal subscore is 5. GCS motor subscore is 6.  Extraocular muscle movement: normal No visual field cut Pupils: equal and reactive both direct and consensual response is normal No nystagmus present    Sensory function is intact to light touch, pinprick Proprioception intact  Grip strength 5/5 symmetric in upper extremities Lower extremity strength 5/5 against gravity No pronator drift Normal finger to nose bilaterally Normal heel to shin bilaterally  Gait: normal    Skin: Skin is warm, dry and intact. No rash noted. No cyanosis.  Psychiatric: She has a normal mood and affect. Her speech is normal and behavior is normal. Thought content normal.  Nursing note and vitals reviewed.   ED Course  Procedures (including critical care time) Labs Review Labs Reviewed  URINALYSIS, ROUTINE W REFLEX MICROSCOPIC - Abnormal; Notable for the following:    Hgb urine dipstick SMALL (*)    Protein, ur TRACE (*)    Leukocytes, UA TRACE (*)    All other components within normal limits  COMPREHENSIVE METABOLIC PANEL - Abnormal; Notable for the following:    Potassium 2.8 (*)    Glucose, Bld 187 (*)    Creatinine, Ser 1.21 (*)    GFR calc non Af Amer 42 (*)    GFR calc Af Amer 49 (*)    Anion gap 3 (*)    All other components within normal limits  URINE MICROSCOPIC-ADD ON - Abnormal; Notable for the following:    Squamous Epithelial / LPF FEW (*)    Bacteria, UA FEW (*)    All other components within normal limits  CBG MONITORING, ED - Abnormal; Notable for the following:    Glucose-Capillary 158 (*)    All other components within normal limits  CBC  WITH DIFFERENTIAL/PLATELET  LIPASE, BLOOD  TROPONIN I  I-STAT CG4 LACTIC ACID, ED    Imaging Review Ct Head Wo Contrast  11/29/2014   CLINICAL DATA:  Confusion. Alzheimer's disease. Agitated and disoriented.  EXAM: CT HEAD WITHOUT CONTRAST  TECHNIQUE: Contiguous axial images were obtained from the base of the skull through the vertex without intravenous contrast.  COMPARISON:  MRI brain 07/13/2012  FINDINGS: There is no evidence of mass effect, midline shift, or extra-axial fluid collections. There is no evidence of a space-occupying lesion or intracranial hemorrhage. There is no evidence of a cortical-based area of acute infarction. There is generalized cerebral atrophy. There is periventricular white matter low attenuation likely secondary to microangiopathy.  The ventricles and sulci are appropriate for the patient's age. The basal cisterns are patent.  Visualized portions of the orbits are unremarkable. The visualized portions of the paranasal sinuses  and mastoid air cells are unremarkable. Cerebrovascular atherosclerotic calcifications are noted.  The osseous structures are unremarkable.  IMPRESSION: 1. No acute intracranial pathology. 2. Chronic microvascular disease and cerebral atrophy.   Electronically Signed   By: Elige KoHetal  Patel   On: 11/29/2014 14:41     EKG Interpretation   Date/Time:  Monday November 29 2014 14:24:38 EST Ventricular Rate:  85 PR Interval:  143 QRS Duration: 87 QT Interval:  389 QTC Calculation: 463 R Axis:   74 Text Interpretation:  Sinus rhythm Probable left atrial enlargement  Borderline repolarization abnormality No previous tracing Confirmed by  Tou Hayner  MD, Naja Apperson ([id number]) on 11/29/2014 2:32:25 PM      MDM   Final diagnoses:  Confusion  Nausea  Hypokalemia    Patient presents to the ER for evaluation of nausea. Patient has not had any vomiting and denies any other concomitant symptoms. She had a benign abdominal exam. Lab work was unremarkable  other than mild hypokalemia. Patient's daughter reports that she has been more confused than usual, but that she does have a history of confusion. Additionally she does have a history of head injury and had hydrocephalus as a result of the trauma. CT head was therefore performed and does not show any acute pathology. His workup has been negative, patient will be treated symptomatically with Zofran, prescribed potassium and is to follow-up with primary doctor.    Gilda Creasehristopher J. Keiran Sias, MD 11/29/14 [phone]

## 2014-11-29 NOTE — ED Notes (Signed)
Pt alert & oriented x4, stable gait. Patient given discharge instructions, paperwork & prescription(s). Patient  instructed to stop at the registration desk to finish any additional paperwork. Patient verbalized understanding. Pt left department w/ no further questions. 

## 2014-12-05 ENCOUNTER — Encounter (HOSPITAL_COMMUNITY): Payer: Self-pay

## 2014-12-05 ENCOUNTER — Emergency Department (HOSPITAL_COMMUNITY)
Admission: EM | Admit: 2014-12-05 | Discharge: 2014-12-05 | Disposition: A | Discharge status: Home / Self Care | Payer: Medicare Other | Attending: Emergency Medicine | Admitting: Emergency Medicine

## 2014-12-05 DIAGNOSIS — G919 Hydrocephalus, unspecified: Secondary | ICD-10-CM

## 2014-12-05 DIAGNOSIS — G309 Alzheimer's disease, unspecified: Secondary | ICD-10-CM | POA: Diagnosis not present

## 2014-12-05 DIAGNOSIS — E119 Type 2 diabetes mellitus without complications: Secondary | ICD-10-CM | POA: Insufficient documentation

## 2014-12-05 DIAGNOSIS — R110 Nausea: Secondary | ICD-10-CM

## 2014-12-05 DIAGNOSIS — Z794 Long term (current) use of insulin: Secondary | ICD-10-CM | POA: Insufficient documentation

## 2014-12-05 DIAGNOSIS — Z79899 Other long term (current) drug therapy: Secondary | ICD-10-CM | POA: Diagnosis not present

## 2014-12-05 DIAGNOSIS — Z7982 Long term (current) use of aspirin: Secondary | ICD-10-CM | POA: Insufficient documentation

## 2014-12-05 NOTE — ED Notes (Signed)
Pt was seen at [facility] last week and given nausea meds.  All tests were negative. Pt nausea continues.  No vomiting, no fever, no pain.

## 2014-12-05 NOTE — ED Provider Notes (Signed)
CSN: [id number]     Arrival date & time 12/05/14  1214 History   First MD Initiated Contact with Patient 12/05/14 1232     Chief Complaint  Patient presents with  . Nausea     (Consider location/radiation/quality/duration/timing/severity/associated sxs/prior Treatment) HPI   Jillian Hart is a 79 y.o. female here with her daughter for report of ongoing nausea, and worsening confusion, over the last several weeks.  She lives alone.  She has hydrocephalus.  She last saw her neurologist years ago.  She was in the ED, 6 days ago and had a comprehensive evaluation for the same problem.  She was given Zofran, which she took, but still has nausea.  There's been no fever, chills, cough, shortness of breath, chest pain, back pain, or difficulty ambulating.  She sometimes forgets to do things, and misplaces things at her home.  She frequently calls her sister who lives next door, to get help with things like getting dressed.  The family members have made arrangements to put her in an assisted living facility.  She saw her PCP, several weeks ago.  She is taking her usual medications.  There are no other known modifying factors.   Past Medical History  Diagnosis Date  . Diabetes mellitus   . Hydrocephalus   . Alzheimer disease    History reviewed. No pertinent past surgical history. Family History  Problem Relation Age of Onset  . Cancer Mother   . Stroke Father    History  Substance Use Topics  . Smoking status: Never Smoker   . Smokeless tobacco: Never Used  . Alcohol Use: No   OB History    Gravida Para Term Preterm AB TAB SAB Ectopic Multiple Living   1 1 1       1      Review of Systems  All other systems reviewed and are negative.     Allergies  Review of patient's allergies indicates no known allergies.  Home Medications   Prior to Admission medications   Medication Sig Start Date End Date Taking? Authorizing Provider  aspirin EC 81 MG tablet Take 81 mg by mouth every  morning.   Yes Historical Provider, MD  buPROPion (WELLBUTRIN XL) 150 MG 24 hr tablet Take 150 mg by mouth every morning.   Yes Historical Provider, MD  insulin detemir (LEVEMIR) 100 UNIT/ML injection Inject 50 Units into the skin every morning.   Yes Historical Provider, MD  insulin lispro (HUMALOG) 100 UNIT/ML KiwkPen Inject 20 Units into the skin 2 (two) times daily.   Yes Historical Provider, MD  ondansetron (ZOFRAN) 4 MG tablet Take 1 tablet (4 mg total) by mouth every 6 (six) hours. 11/29/14  Yes Gilda Creasehristopher J. Pollina, MD  potassium chloride SA (K-DUR,KLOR-CON) 20 MEQ tablet Take 1 tablet (20 mEq total) by mouth daily. Patient taking differently: Take 20 mEq by mouth daily. For 7 days 11/29/14  Yes Gilda Creasehristopher J. Pollina, MD  rosuvastatin (CRESTOR) 20 MG tablet Take 20 mg by mouth every morning.   Yes Historical Provider, MD  traMADol (ULTRAM) 50 MG tablet Take 1 tablet (50 mg total) by mouth every 6 (six) hours as needed for pain. Patient not taking: Reported on 11/29/2014 02/25/13   Gilda Creasehristopher J. Pollina, MD   BP 151/83 mmHg  Pulse 109  Temp(Src) 97.6 F (36.4 C) (Oral)  Resp 17  SpO2 100% Physical Exam  Constitutional: She appears well-developed.  Elderly, frail  HENT:  Head: Normocephalic and atraumatic.  Right Ear: External  ear normal.  Left Ear: External ear normal.  Eyes: Conjunctivae and EOM are normal. Pupils are equal, round, and reactive to light.  Neck: Normal range of motion and phonation normal. Neck supple.  Cardiovascular: Normal rate, regular rhythm and normal heart sounds.   Pulmonary/Chest: Effort normal and breath sounds normal. She exhibits no bony tenderness.  Abdominal: Soft. There is no tenderness.  Musculoskeletal: Normal range of motion. She exhibits no tenderness.  Neurological: She is alert. No cranial nerve deficit or sensory deficit. She exhibits normal muscle tone. Coordination normal.  No dysarthria and aphasia or nystagmus.  Romberg is negative.   With ambulation, she has a somewhat slow and shuffling gait but is stable and can manage on her own.  She does not have a wide-based gait consistent with hydrocephalus.  Skin: Skin is warm, dry and intact.  Psychiatric: She has a normal mood and affect. Her behavior is normal. Judgment and thought content normal.  Nursing note and vitals reviewed.   ED Course  Procedures (including critical care time)  Findings discussed with patient and daughter, all questions answered.  There is no indication for further evaluation in the ED setting.    MDM   Final diagnoses:  Nausea  Hydrocephalus     Nonspecific ongoing symptoms are reevaluated in the ED setting, and by her PCP.  I doubt that she has a worsening of her hydrocephalus.  It would be a cause for this array of problems.  I doubt serous bacterial infection.  Metabolic instability or impending vascular collapse.  Nursing Notes Reviewed/ Care Coordinated Applicable Imaging Reviewed Interpretation of Laboratory Data incorporated into ED treatment  The patient appears reasonably screened and/or stabilized for discharge and I doubt any other medical condition or other [facility] requiring further screening, evaluation, or treatment in the ED at this time prior to discharge.  Plan: Home Medications- usual; Home Treatments- rest; return here if the recommended treatment, does not improve the symptoms; Recommended follow up- PCP, one week.  Follow-up with neurology to make sure there is no complication from hydrocephalus or treatable causes of dementia     Flint Melter, MD 12/05/14 [phone]

## 2014-12-05 NOTE — Discharge Instructions (Signed)
There is no clear cause for your nausea, today.  Since you're eating well, it is reasonable to continue doing that.  Your balance is somewhat off and it may be helpful to use a cane when you walk.  There is no sign that you have a contagious disease that would prevent you from being around other people.  Follow-up with your doctors for further evaluation and treatment in 1 or 2 weeks.

## 2015-01-13 ENCOUNTER — Emergency Department (HOSPITAL_COMMUNITY)
Admission: EM | Admit: 2015-01-13 | Discharge: 2015-01-13 | Disposition: A | Discharge status: Home / Self Care | Payer: Medicare Other | Attending: Emergency Medicine | Admitting: Emergency Medicine

## 2015-01-13 ENCOUNTER — Encounter (HOSPITAL_COMMUNITY): Payer: Self-pay | Admitting: *Deleted

## 2015-01-13 ENCOUNTER — Emergency Department (HOSPITAL_COMMUNITY): Payer: Medicare Other

## 2015-01-13 DIAGNOSIS — W19XXXA Unspecified fall, initial encounter: Secondary | ICD-10-CM

## 2015-01-13 DIAGNOSIS — Y9289 Other specified places as the place of occurrence of the external cause: Secondary | ICD-10-CM | POA: Insufficient documentation

## 2015-01-13 DIAGNOSIS — S7011XA Contusion of right thigh, initial encounter: Secondary | ICD-10-CM | POA: Insufficient documentation

## 2015-01-13 DIAGNOSIS — S79911A Unspecified injury of right hip, initial encounter: Secondary | ICD-10-CM | POA: Diagnosis present

## 2015-01-13 DIAGNOSIS — Z7982 Long term (current) use of aspirin: Secondary | ICD-10-CM | POA: Diagnosis not present

## 2015-01-13 DIAGNOSIS — S8011XA Contusion of right lower leg, initial encounter: Secondary | ICD-10-CM

## 2015-01-13 DIAGNOSIS — G309 Alzheimer's disease, unspecified: Secondary | ICD-10-CM | POA: Insufficient documentation

## 2015-01-13 DIAGNOSIS — G919 Hydrocephalus, unspecified: Secondary | ICD-10-CM | POA: Diagnosis not present

## 2015-01-13 DIAGNOSIS — E119 Type 2 diabetes mellitus without complications: Secondary | ICD-10-CM | POA: Insufficient documentation

## 2015-01-13 DIAGNOSIS — W1839XA Other fall on same level, initial encounter: Secondary | ICD-10-CM | POA: Insufficient documentation

## 2015-01-13 DIAGNOSIS — Z794 Long term (current) use of insulin: Secondary | ICD-10-CM | POA: Diagnosis not present

## 2015-01-13 DIAGNOSIS — Y9389 Activity, other specified: Secondary | ICD-10-CM | POA: Insufficient documentation

## 2015-01-13 DIAGNOSIS — S99911A Unspecified injury of right ankle, initial encounter: Secondary | ICD-10-CM | POA: Insufficient documentation

## 2015-01-13 DIAGNOSIS — Y998 Other external cause status: Secondary | ICD-10-CM | POA: Insufficient documentation

## 2015-01-13 LAB — CBG MONITORING, ED: GLUCOSE-CAPILLARY: 136 mg/dL — AB (ref 70–99)

## 2015-01-13 NOTE — ED Notes (Signed)
Patient transported to X-ray 

## 2015-01-13 NOTE — ED Provider Notes (Signed)
CSN: [id number]     Arrival date & time 01/13/15  1208 History   This chart was scribed for non-physician practitioner, Emilia Beck, PA-C, working with Eber Hong, MD, by Abel Presto, ED Scribe. This patient was seen in room TR06C/TR06C and the patient's care was started at 2:12 PM.     Chief Complaint  Patient presents with  . Fall     Patient is a 79 y.o. female presenting with fall. The history is provided by the patient. No language interpreter was used.  Fall Pertinent negatives include no headaches.   HPI Comments: AMARACHUKWU LAKATOS is a 79 y.o. female with PMHx of DM, hydrocephalus, and Alzheimer disease who presents to the Emergency Department complaining of fall 5 days ago. Pt states she was hypoglycemic when she fell. Pt is able to ambulate. Pt notes associated right hip pain with associated bruising and right ankle pain. Pt's daughter states pt has had frequent falls recently. Pt has taken ibuprofen for relief. Pt denies any other injury, head injury, or LOC.   Past Medical History  Diagnosis Date  . Diabetes mellitus   . Hydrocephalus   . Alzheimer disease    No past surgical history on file. Family History  Problem Relation Age of Onset  . Cancer Mother   . Stroke Father    History  Substance Use Topics  . Smoking status: Never Smoker   . Smokeless tobacco: Never Used  . Alcohol Use: No   OB History    Gravida Para Term Preterm AB TAB SAB Ectopic Multiple Living   Review of Systems  Musculoskeletal: Positive for myalgias.  Neurological: Negative for syncope and headaches.  All other systems reviewed and are negative.     Allergies  Review of patient's allergies indicates no known allergies.  Home Medications   Prior to Admission medications   Medication Sig Start Date End Date Taking? Authorizing Provider  aspirin EC 81 MG tablet Take 81 mg by mouth every morning.    Historical Provider, MD  buPROPion (WELLBUTRIN XL) 150 MG  24 hr tablet Take 150 mg by mouth every morning.    Historical Provider, MD  insulin detemir (LEVEMIR) 100 UNIT/ML injection Inject 50 Units into the skin every morning.    Historical Provider, MD  insulin lispro (HUMALOG) 100 UNIT/ML KiwkPen Inject 20 Units into the skin 2 (two) times daily.    Historical Provider, MD  ondansetron (ZOFRAN) 4 MG tablet Take 1 tablet (4 mg total) by mouth every 6 (six) hours. 11/29/14   Gilda Crease, MD  potassium chloride SA (K-DUR,KLOR-CON) 20 MEQ tablet Take 1 tablet (20 mEq total) by mouth daily. Patient taking differently: Take 20 mEq by mouth daily. For 7 days 11/29/14   Gilda Crease, MD  rosuvastatin (CRESTOR) 20 MG tablet Take 20 mg by mouth every morning.    Historical Provider, MD  traMADol (ULTRAM) 50 MG tablet Take 1 tablet (50 mg total) by mouth every 6 (six) hours as needed for pain. Patient not taking: Reported on 11/29/2014 02/25/13   Gilda Crease, MD   BP 146/89 mmHg  Pulse 96  Temp(Src) 98.1 F (36.7 C) (Oral)  Resp 18  Ht  (1.473 m)  Wt 115 lb (52.164 kg)  BMI 24.04 kg/m2  SpO2 100% Physical Exam  Constitutional: She is oriented to person, place, and time. She appears well-developed and well-nourished. No distress.  HENT:  Head: Normocephalic and atraumatic.  Eyes: Conjunctivae are normal.  Neck: Normal range of motion. Neck supple.  Cardiovascular: Normal rate and regular rhythm.  Exam reveals no gallop and no friction rub.   No murmur heard. Pulmonary/Chest: Effort normal and breath sounds normal. She has no wheezes. She has no rales. She exhibits no tenderness.  Abdominal: Soft. She exhibits no distension. There is no tenderness. There is no rebound.  Musculoskeletal: Normal range of motion.  bruising of lateral right thigh; no bony deformity of right hip or right ankle; full ROM.   Neurological: She is alert and oriented to person, place, and time. Coordination normal.  Speech is goal-oriented. Moves  limbs without ataxia.   Skin: Skin is warm and dry.  Psychiatric: She has a normal mood and affect. Her behavior is normal.  Nursing note and vitals reviewed.   ED Course  Procedures (including critical care time) DIAGNOSTIC STUDIES: Oxygen Saturation is 100% on room air, normal by my interpretation.    COORDINATION OF CARE: 2:12 PM Discussed treatment plan with patient at beside, the patient agrees with the plan and has no further questions at this time.   Labs Review Labs Reviewed  CBG MONITORING, ED - Abnormal; Notable for the following:    Glucose-Capillary 136 (*)    All other components within normal limits    Imaging Review Dg Pelvis 1-2 Views  01/13/2015   CLINICAL DATA:  Fall 1 week ago.  Pelvic pain.  Initial encounter.  EXAM: PELVIS - 1-2 VIEW  COMPARISON:  None.  FINDINGS: There is no evidence of pelvic fracture or diastasis. No pelvic bone lesions are seen. Incidental note is made of degenerative disc disease at L4-5.  IMPRESSION: No evidence of pelvic fracture.  Lower lumbar spine degenerative spondylosis incidentally noted.   Electronically Signed   By: Myles RosenthalJohn  Stahl M.D.   On: 01/13/2015 13:45   Dg Ankle Complete Right  01/13/2015   CLINICAL DATA:  Larey SeatFell last Thursday.  Pain lateral malleolus.  EXAM: RIGHT ANKLE - COMPLETE 3+ VIEW  COMPARISON:  06/22/2012.  FINDINGS: Mild soft tissue swelling laterally and anteriorly. No fracture or dislocation.  IMPRESSION: Mild soft tissue swelling.  No fracture.   Electronically Signed   By: Davonna BellingJohn  Curnes M.D.   On: 01/13/2015 13:45     EKG Interpretation None      MDM   Final diagnoses:  Fall, initial encounter  Hematoma of leg, right, initial encounter  Right ankle injury, initial encounter   2:36 PM Xrays unremarkable for acute changes. No other injuries. Vitals stable and patient afebrile.   I personally performed the services described in this documentation, which was scribed in my presence. The recorded information has  been reviewed and is accurate.     Emilia BeckKaitlyn Temple Sporer, PA-C 01/13/15 1437  Eber HongBrian Miller, MD 01/13/15 [id number]

## 2015-01-13 NOTE — ED Notes (Signed)
Pt fell on Sat.  Per daughter, it was because she was hypoglycemic.  Pt waited to come in today d/t hematoma to R hip and continued R ankle pain.

## 2015-01-13 NOTE — Progress Notes (Addendum)
LCSW  Has been following patient and working with patient daughter. Daughter has been working in the community with PCP and [facility] to facility. Patient is not agreeable per daughter and is rather angry about the placement.  Daughter brought patient in to review her most recent fall and assess if there were any fractures. Patient is cleared medically.  LCSW called and spoke with [facility].  Rosanne AshingJim agreeable for patient to be admitted today.   LCSW will call for ambulance as Rosanne [facility]ingJim cannot provide transport. Family is aware that patient does not qualify for ambulance as she can sit in chair.  They are agreeable to pay bill for transport.     Will review with daughter and patient to DC to ALF. No medical records needed at this time per ALF.  Spoke with patient daughter. Discussed plan an ambulance. Discussed again medical expense and if patient refuses ambulance will not take her against her will. Daughter does not have guardianship, on POA. Patient has capacity at this time. Daughter has called her daughter who will be taking patient by car.  Patient will go have lunch and then arrive at facility by car. No ambulance needed at this point. Daughter agreeable to plan.  Deretha EmoryHannah Amarien Carne LCSW, MSW Clinical Social Work: Emergency Room [phone]

## 2015-01-13 NOTE — ED Notes (Signed)
Pt left before being discharged.

## 2015-01-13 NOTE — Discharge Instructions (Signed)
Take ibuprofen or tylenol as needed for pain. Apply ice for pain relief. Refer to attached documents for more information.

## 2015-03-14 ENCOUNTER — Encounter (HOSPITAL_COMMUNITY): Payer: Self-pay | Admitting: Oncology

## 2015-03-14 ENCOUNTER — Emergency Department (HOSPITAL_COMMUNITY)
Admission: EM | Admit: 2015-03-14 | Discharge: 2015-03-14 | Disposition: A | Discharge status: Home / Self Care | Payer: Medicare Other | Attending: Emergency Medicine | Admitting: Emergency Medicine

## 2015-03-14 DIAGNOSIS — R112 Nausea with vomiting, unspecified: Secondary | ICD-10-CM | POA: Diagnosis present

## 2015-03-14 DIAGNOSIS — G309 Alzheimer's disease, unspecified: Secondary | ICD-10-CM | POA: Insufficient documentation

## 2015-03-14 DIAGNOSIS — E11649 Type 2 diabetes mellitus with hypoglycemia without coma: Secondary | ICD-10-CM | POA: Diagnosis not present

## 2015-03-14 DIAGNOSIS — E162 Hypoglycemia, unspecified: Secondary | ICD-10-CM

## 2015-03-14 DIAGNOSIS — Z7982 Long term (current) use of aspirin: Secondary | ICD-10-CM | POA: Diagnosis not present

## 2015-03-14 DIAGNOSIS — Z79899 Other long term (current) drug therapy: Secondary | ICD-10-CM | POA: Diagnosis not present

## 2015-03-14 DIAGNOSIS — Z794 Long term (current) use of insulin: Secondary | ICD-10-CM | POA: Diagnosis not present

## 2015-03-14 LAB — URINALYSIS, ROUTINE W REFLEX MICROSCOPIC
BILIRUBIN URINE: NEGATIVE
Glucose, UA: NEGATIVE mg/dL
Hgb urine dipstick: NEGATIVE
KETONES UR: NEGATIVE mg/dL
NITRITE: NEGATIVE
PROTEIN: NEGATIVE mg/dL
SPECIFIC GRAVITY, URINE: 1.02 (ref 1.005–1.030)
Urobilinogen, UA: 0.2 mg/dL (ref 0.0–1.0)
pH: 6.5 (ref 5.0–8.0)

## 2015-03-14 LAB — CBC WITH DIFFERENTIAL/PLATELET
Basophils Absolute: 0 10*3/uL (ref 0.0–0.1)
Basophils Relative: 0 % (ref 0–1)
Eosinophils Absolute: 0 10*3/uL (ref 0.0–0.7)
Eosinophils Relative: 0 % (ref 0–5)
HEMATOCRIT: 39.9 % (ref 36.0–46.0)
HEMOGLOBIN: 12.9 g/dL (ref 12.0–15.0)
LYMPHS PCT: 13 % (ref 12–46)
Lymphs Abs: 1.1 10*3/uL (ref 0.7–4.0)
MCH: 27.2 pg (ref 26.0–34.0)
MCHC: 32.3 g/dL (ref 30.0–36.0)
MCV: 84.2 fL (ref 78.0–100.0)
MONO ABS: 0.3 10*3/uL (ref 0.1–1.0)
Monocytes Relative: 4 % (ref 3–12)
NEUTROS ABS: 6.8 10*3/uL (ref 1.7–7.7)
Neutrophils Relative %: 83 % — ABNORMAL HIGH (ref 43–77)
PLATELETS: 199 10*3/uL (ref 150–400)
RBC: 4.74 MIL/uL (ref 3.87–5.11)
RDW: 14.2 % (ref 11.5–15.5)
WBC: 8.2 10*3/uL (ref 4.0–10.5)

## 2015-03-14 LAB — CBG MONITORING, ED
GLUCOSE-CAPILLARY: 84 mg/dL (ref 65–99)
GLUCOSE-CAPILLARY: 98 mg/dL (ref 65–99)

## 2015-03-14 LAB — I-STAT CHEM 8, ED
BUN: 19 mg/dL (ref 6–20)
CALCIUM ION: 1.14 mmol/L (ref 1.13–1.30)
CHLORIDE: 103 mmol/L (ref 101–111)
Creatinine, Ser: 0.8 mg/dL (ref 0.44–1.00)
GLUCOSE: 103 mg/dL — AB (ref 65–99)
HCT: 41 % (ref 36.0–46.0)
Hemoglobin: 13.9 g/dL (ref 12.0–15.0)
POTASSIUM: 4.2 mmol/L (ref 3.5–5.1)
SODIUM: 141 mmol/L (ref 135–145)
TCO2: 26 mmol/L (ref 0–100)

## 2015-03-14 LAB — URINE MICROSCOPIC-ADD ON

## 2015-03-14 NOTE — ED Notes (Signed)
Per EMS pt's CBG was 31 at the SNF at 0135, pt given 240 ml of orange juice and 4 peanut butter crackers.  At 0245 CBG was 25.  Pt given another 240 ml of orange juice and 4 peanut butter crackers.  When EMS arrived at 0324 CBG was 82.  Pt had no c/o at that time.  Shortly thereafter pt began to vomit and was brought in to be evaluated.  Pt given of 4 mg of zofran en route.

## 2015-03-14 NOTE — ED Notes (Signed)
Bed: WA15 Expected date:  Expected time:  Means of arrival:  Comments: EMS  

## 2015-03-14 NOTE — ED Provider Notes (Addendum)
Pt has dementia and stays in a nursing facility. She had hypoglycemia tonight at 31 and was given OJ and peanut butter crackers. When her CBG was rechecked about an hour later it was 25. She was given the same to eat and drink. On EMS arrival her CBG was 782 and patient started to have vomiting.   Pt is pleasant smiling in NAD. Mildly pale.  No respiratory distress.    Medical screening examination/treatment/procedure(s) were conducted as a shared visit with non-physician practitioner(s) and myself.  I personally evaluated the patient during the encounter.   EKG Interpretation None        Devoria AlbeIva Latisa Belay, MD, Concha PyoFACEP   Saben Donigan, MD 03/14/15 [id number]  Devoria AlbeIva Shanette Tamargo, MD 03/14/15 [phone]

## 2015-03-14 NOTE — Discharge Instructions (Signed)
Follow up with your doctor for evaluation of your hypoglycemia.  Please consider adjusting nightly dose of insulin to account for hypoglycemic episodes.  Return to the ER with any worsening of signs and symptoms.     Hypoglycemia Hypoglycemia occurs when the glucose in your blood is too low. Glucose is a type of sugar that is your body's main energy source. Hormones, such as insulin and glucagon, control the level of glucose in the blood. Insulin lowers blood glucose and glucagon increases blood glucose. Having too much insulin in your blood stream, or not eating enough food containing sugar, can result in hypoglycemia. Hypoglycemia can happen to people with or without diabetes. It can develop quickly and can be a medical emergency.  CAUSES   Missing or delaying meals.  Not eating enough carbohydrates at meals.  Taking too much diabetes medicine.  Not timing your oral diabetes medicine or insulin doses with meals, snacks, and exercise.  Nausea and vomiting.  Certain medicines.  Severe illnesses, such as hepatitis, kidney disorders, and certain eating disorders.  Increased activity or exercise without eating something extra or adjusting medicines.  Drinking too much alcohol.  A nerve disorder that affects body functions like your heart rate, blood pressure, and digestion (autonomic neuropathy).  A condition where the stomach muscles do not function properly (gastroparesis). Therefore, medicines and food may not absorb properly.  Rarely, a tumor of the pancreas can produce too much insulin. SYMPTOMS   Hunger.  Sweating (diaphoresis).  Change in body temperature.  Shakiness.  Headache.  Anxiety.  Lightheadedness.  Irritability.  Difficulty concentrating.  Dry mouth.  Tingling or numbness in the hands or feet.  Restless sleep or sleep disturbances.  Altered speech and coordination.  Change in mental status.  Seizures or prolonged  convulsions.  Combativeness.  Drowsiness (lethargic).  Weakness.  Increased heart rate or palpitations.  Confusion.  Pale, gray skin color.  Blurred or double vision.  Fainting. DIAGNOSIS  A physical exam and medical history will be performed. Your caregiver may make a diagnosis based on your symptoms. Blood tests and other lab tests may be performed to confirm a diagnosis. Once the diagnosis is made, your caregiver will see if your signs and symptoms go away once your blood glucose is raised.  TREATMENT  Usually, you can easily treat your hypoglycemia when you notice symptoms.  Check your blood glucose. If it is less than 70 mg/dl, take one of the following:   3-4 glucose tablets.    cup juice.    cup regular soda.   1 cup skim milk.   -1 tube of glucose gel.   5-6 hard candies.   Avoid high-fat drinks or food that may delay a rise in blood glucose levels.  Do not take more than the recommended amount of sugary foods, drinks, gel, or tablets. Doing so will cause your blood glucose to go too high.   Wait 10-15 minutes and recheck your blood glucose. If it is still less than 70 mg/dl or below your target range, repeat treatment.   Eat a snack if it is more than 1 hour until your next meal.  There may be a time when your blood glucose may go so low that you are unable to treat yourself at home when you start to notice symptoms. You may need someone to help you. You may even faint or be unable to swallow. If you cannot treat yourself, someone will need to bring you to the hospital.  HOME CARE  INSTRUCTIONS  If you have diabetes, follow your diabetes management plan by:  Taking your medicines as directed.  Following your exercise plan.  Following your meal plan. Do not skip meals. Eat on time.  Testing your blood glucose regularly. Check your blood glucose before and after exercise. If you exercise longer or different than usual, be sure to check blood  glucose more frequently.  Wearing your medical alert jewelry that says you have diabetes.  Identify the cause of your hypoglycemia. Then, develop ways to prevent the recurrence of hypoglycemia.  Do not take a hot bath or shower right after an insulin shot.  Always carry treatment with you. Glucose tablets are the easiest to carry.  If you are going to drink alcohol, drink it only with meals.  Tell friends or family members ways to keep you safe during a seizure. This may include removing hard or sharp objects from the area or turning you on your side.  Maintain a healthy weight. SEEK MEDICAL CARE IF:   You are having problems keeping your blood glucose in your target range.  You are having frequent episodes of hypoglycemia.  You feel you might be having side effects from your medicines.  You are not sure why your blood glucose is dropping so low.  You notice a change in vision or a new problem with your vision. SEEK IMMEDIATE MEDICAL CARE IF:   Confusion develops.  A change in mental status occurs.  The inability to swallow develops.  Fainting occurs. Document Released: 10/01/2005 Document Revised: 10/06/2013 Document Reviewed: 01/28/2012 [facility] Patient Information 2015 [employer], [place]. This information is not intended to replace advice given to you by your health care provider. Make sure you discuss any questions you have with your health care provider.

## 2015-03-14 NOTE — ED Provider Notes (Signed)
CSN: [id number]     Arrival date & time 03/14/15  0404 History   First MD Initiated Contact with Patient 03/14/15 [phone]     Chief Complaint  Patient presents with  . Nausea     (Consider location/radiation/quality/duration/timing/severity/associated sxs/prior Treatment) HPI Comments: This is a very pleasant 79 year old female who lives in a nursing home due to dementia and diabetes brought in by EMS who were called to the nursing home due to patient's vomiting persistently low blood sugars despite oral supplementation.  She was given her normal insulin doses last night, which include Levemir 50 units at breakfast and Humalog twice a day.  Per report, patient has had good appetite.  She did eat dinner.  She had no noticeable hypoglycemic events until approximately 1:30 when she was also complaining of nausea.  She had several episodes of vomiting.  She was given 8 ounces of orange juice and peanut butter crackers at 1:30 and again at 245.  When EMS arrived, she vomited a large amount of RMG fluid consistent with orange juice consumption. On arrival to the emergency department.  She is in no distress.  She does say she has a little nausea but denies any recent URI symptoms, abdominal pain, diarrhea or constipation, dysuria, frequency, headache, excessive thirst  The history is provided by the patient and the EMS personnel.    Past Medical History  Diagnosis Date  . Diabetes mellitus   . Hydrocephalus   . Alzheimer disease    History reviewed. No pertinent past surgical history. Family History  Problem Relation Age of Onset  . Cancer Mother   . Stroke Father    History  Substance Use Topics  . Smoking status: Never Smoker   . Smokeless tobacco: Never Used  . Alcohol Use: No   OB History    Gravida Para Term Preterm AB TAB SAB Ectopic Multiple Living   1 1 1       1      Review of Systems  Constitutional: Negative for fever.  Eyes: Negative for visual disturbance.  Respiratory:  Negative for shortness of breath.   Cardiovascular: Negative for leg swelling.  Gastrointestinal: Negative for abdominal pain.  Genitourinary: Negative for dysuria and frequency.  Musculoskeletal: Negative for myalgias.  Skin: Negative for rash.  Neurological: Negative for dizziness, weakness and headaches.  All other systems reviewed and are negative.     Allergies  Review of patient's allergies indicates no known allergies.  Home Medications   Prior to Admission medications   Medication Sig Start Date End Date Taking? Authorizing Provider  acetaminophen (TYLENOL) 500 MG tablet Take 500 mg by mouth 2 (two) times daily.   Yes Historical Provider, MD  aspirin EC 81 MG tablet Take 81 mg by mouth every morning.   Yes Historical Provider, MD  atorvastatin (LIPITOR) 40 MG tablet Take 40 mg by mouth daily.   Yes Historical Provider, MD  buPROPion (WELLBUTRIN XL) 150 MG 24 hr tablet Take 150 mg by mouth every morning.   Yes Historical Provider, MD  donepezil (ARICEPT) 10 MG tablet Take 10 mg by mouth at bedtime.   Yes Historical Provider, MD  insulin detemir (LEVEMIR) 100 UNIT/ML injection Inject 50 Units into the skin every morning.   Yes Historical Provider, MD  insulin lispro (HUMALOG) 100 UNIT/ML KiwkPen Inject 20 Units into the skin every evening.    Yes Historical Provider, MD  lisinopril (PRINIVIL,ZESTRIL) 10 MG tablet Take 10 mg by mouth daily.   Yes Historical Provider,  MD  LORazepam (ATIVAN) 0.5 MG tablet Take 0.25 mg by mouth daily as needed for anxiety.   Yes Historical Provider, MD  potassium chloride (MICRO-K) 10 MEQ CR capsule Take 10 mEq by mouth daily.   Yes Historical Provider, MD  ondansetron (ZOFRAN) 4 MG tablet Take 1 tablet (4 mg total) by mouth every 6 (six) hours. Patient not taking: Reported on 03/14/2015 11/29/14   Gilda Crease, MD  potassium chloride SA (K-DUR,KLOR-CON) 20 MEQ tablet Take 1 tablet (20 mEq total) by mouth daily. Patient taking differently:  Take 20 mEq by mouth daily. For 7 days 11/29/14   Gilda Crease, MD  rosuvastatin (CRESTOR) 20 MG tablet Take 20 mg by mouth every morning.    Historical Provider, MD  traMADol (ULTRAM) 50 MG tablet Take 1 tablet (50 mg total) by mouth every 6 (six) hours as needed for pain. Patient not taking: Reported on 11/29/2014 02/25/13   Gilda Crease, MD   BP 139/96 mmHg  Pulse 88  Temp(Src) 97.8 F (36.6 C) (Oral)  Resp 17  Ht  (1.473 m)  Wt 110 lb (49.896 kg)  BMI 23.00 kg/m2  SpO2 99% Physical Exam  Constitutional: She appears well-developed and well-nourished.  HENT:  Head: Normocephalic.  Mouth/Throat: Oropharynx is clear and moist.  Neck: Normal range of motion.  Cardiovascular: Normal rate and regular rhythm.   Pulmonary/Chest: Effort normal and breath sounds normal.  Abdominal: Soft. She exhibits no distension.  Musculoskeletal: Normal range of motion. She exhibits no edema.  Lymphadenopathy:    She has no cervical adenopathy.  Neurological: She is alert.  Skin: Skin is warm and dry. There is pallor.  Nursing note and vitals reviewed.   ED Course  Procedures (including critical care time) Labs Review Labs Reviewed  CBC WITH DIFFERENTIAL/PLATELET - Abnormal; Notable for the following:    Neutrophils Relative % 83 (*)    All other components within normal limits  URINALYSIS, ROUTINE W REFLEX MICROSCOPIC (NOT AT [facility]) - Abnormal; Notable for the following:    Leukocytes, UA TRACE (*)    All other components within normal limits  I-STAT CHEM 8, ED - Abnormal; Notable for the following:    Glucose, Bld 103 (*)    All other components within normal limits  URINE CULTURE  URINE MICROSCOPIC-ADD ON  CBG MONITORING, ED  CBG MONITORING, ED    Imaging Review No results found.   EKG Interpretation None      MDM   Final diagnoses:  Hypoglycemia        Earley Favor, NP 03/14/15 Corky Crafts  Devoria Albe, MD 03/16/15 2300

## 2015-03-14 NOTE — ED Provider Notes (Signed)
Patient was signed out to me by Anysha StainGail Shultz, NP-C with plan to follow-up on labs and urine. Plan to most likely discharged home if labs and urine unremarkable and CBG is stable.  PE: Constitutional: well-developed, well-nourished, no apparent distress HENT: normocephalic, atraumatic Cardiovascular: normal rate and rhythm, distal pulses intact Pulmonary/Chest: effort normal; breath sounds clear and equal bilaterally; no wheezes or rales Abdominal: soft and nontender Musculoskeletal: full ROM, no edema Lymphadenopathy: no cervical adenopathy Neurological: alert with goal directed thinking Skin: warm and dry, no rash, no diaphoresis Psychiatric: normal mood and affect, normal behavior   Labs unremarkable for acute pathology. UA with small amount of leukocytes. Patient without signs or symptoms of UTI. We'll send urine culture. CBG noticed to be 85, then 98 approximately one hour later. Patient continues to deny having any specific complaints to me. She well-appearing, hemodynamic is stable and in no acute distress. Patient discharged home in stable. Strongly recommended patient follows up with her PCP to adjust insulin dosage, most likely Humalog dosage at nighttime due to hypoglycemic episodes. Return precautions discussed, patient [facility]    BP 139/96 mmHg  Pulse 88  Temp(Src) 97.8 F (36.6 C) (Oral)  Resp 17  Ht 4\' 10"  (1.473 m)  Wt 110 lb (49.896 kg)  BMI 23.00 kg/m2  SpO2 99%  Signed,  Ladona MowJoe Francella Barnett, PA-C 10:02 AM  Patient seen and discussed with Dr. Devoria AlbeIva Knapp, MD  Ladona MowJoe Austyn Seier, PA-C 03/14/15 1003  Devoria AlbeIva Knapp, MD 03/16/15 [id number]  Devoria AlbeIva Knapp, MD 03/16/15 2300

## 2015-03-15 LAB — URINE CULTURE

## 2015-11-28 DIAGNOSIS — B070 Plantar wart: Secondary | ICD-10-CM | POA: Diagnosis not present

## 2015-11-28 DIAGNOSIS — F0390 Unspecified dementia without behavioral disturbance: Secondary | ICD-10-CM | POA: Diagnosis not present

## 2015-11-28 DIAGNOSIS — B351 Tinea unguium: Secondary | ICD-10-CM | POA: Diagnosis not present

## 2015-11-28 DIAGNOSIS — E119 Type 2 diabetes mellitus without complications: Secondary | ICD-10-CM | POA: Diagnosis not present

## 2015-11-28 DIAGNOSIS — E559 Vitamin D deficiency, unspecified: Secondary | ICD-10-CM | POA: Diagnosis not present

## 2015-11-28 DIAGNOSIS — I10 Essential (primary) hypertension: Secondary | ICD-10-CM | POA: Diagnosis not present

## 2015-11-28 DIAGNOSIS — M79673 Pain in unspecified foot: Secondary | ICD-10-CM | POA: Diagnosis not present

## 2015-11-28 DIAGNOSIS — L851 Acquired keratosis [keratoderma] palmaris et plantaris: Secondary | ICD-10-CM | POA: Diagnosis not present

## 2015-11-28 DIAGNOSIS — F331 Major depressive disorder, recurrent, moderate: Secondary | ICD-10-CM | POA: Diagnosis not present

## 2015-11-28 DIAGNOSIS — E785 Hyperlipidemia, unspecified: Secondary | ICD-10-CM | POA: Diagnosis not present

## 2015-12-26 DIAGNOSIS — E559 Vitamin D deficiency, unspecified: Secondary | ICD-10-CM | POA: Diagnosis not present

## 2015-12-26 DIAGNOSIS — E119 Type 2 diabetes mellitus without complications: Secondary | ICD-10-CM | POA: Diagnosis not present

## 2015-12-26 DIAGNOSIS — E785 Hyperlipidemia, unspecified: Secondary | ICD-10-CM | POA: Diagnosis not present

## 2015-12-26 DIAGNOSIS — F331 Major depressive disorder, recurrent, moderate: Secondary | ICD-10-CM | POA: Diagnosis not present

## 2015-12-26 DIAGNOSIS — I10 Essential (primary) hypertension: Secondary | ICD-10-CM | POA: Diagnosis not present

## 2015-12-26 DIAGNOSIS — F0390 Unspecified dementia without behavioral disturbance: Secondary | ICD-10-CM | POA: Diagnosis not present

## 2016-01-27 DIAGNOSIS — F0390 Unspecified dementia without behavioral disturbance: Secondary | ICD-10-CM | POA: Diagnosis not present

## 2016-01-27 DIAGNOSIS — I10 Essential (primary) hypertension: Secondary | ICD-10-CM | POA: Diagnosis not present

## 2016-01-27 DIAGNOSIS — E559 Vitamin D deficiency, unspecified: Secondary | ICD-10-CM | POA: Diagnosis not present

## 2016-01-27 DIAGNOSIS — E119 Type 2 diabetes mellitus without complications: Secondary | ICD-10-CM | POA: Diagnosis not present

## 2016-01-27 DIAGNOSIS — F331 Major depressive disorder, recurrent, moderate: Secondary | ICD-10-CM | POA: Diagnosis not present

## 2016-01-27 DIAGNOSIS — E785 Hyperlipidemia, unspecified: Secondary | ICD-10-CM | POA: Diagnosis not present

## 2016-02-08 DIAGNOSIS — I10 Essential (primary) hypertension: Secondary | ICD-10-CM | POA: Diagnosis not present

## 2016-02-08 DIAGNOSIS — E039 Hypothyroidism, unspecified: Secondary | ICD-10-CM | POA: Diagnosis not present

## 2016-02-08 DIAGNOSIS — E119 Type 2 diabetes mellitus without complications: Secondary | ICD-10-CM | POA: Diagnosis not present

## 2016-02-08 DIAGNOSIS — E782 Mixed hyperlipidemia: Secondary | ICD-10-CM | POA: Diagnosis not present

## 2016-02-24 DIAGNOSIS — I10 Essential (primary) hypertension: Secondary | ICD-10-CM | POA: Diagnosis not present

## 2016-02-24 DIAGNOSIS — F331 Major depressive disorder, recurrent, moderate: Secondary | ICD-10-CM | POA: Diagnosis not present

## 2016-02-24 DIAGNOSIS — F0390 Unspecified dementia without behavioral disturbance: Secondary | ICD-10-CM | POA: Diagnosis not present

## 2016-02-24 DIAGNOSIS — E559 Vitamin D deficiency, unspecified: Secondary | ICD-10-CM | POA: Diagnosis not present

## 2016-02-24 DIAGNOSIS — E119 Type 2 diabetes mellitus without complications: Secondary | ICD-10-CM | POA: Diagnosis not present

## 2016-02-24 DIAGNOSIS — E785 Hyperlipidemia, unspecified: Secondary | ICD-10-CM | POA: Diagnosis not present

## 2016-03-23 DIAGNOSIS — E559 Vitamin D deficiency, unspecified: Secondary | ICD-10-CM | POA: Diagnosis not present

## 2016-03-23 DIAGNOSIS — F331 Major depressive disorder, recurrent, moderate: Secondary | ICD-10-CM | POA: Diagnosis not present

## 2016-03-23 DIAGNOSIS — I10 Essential (primary) hypertension: Secondary | ICD-10-CM | POA: Diagnosis not present

## 2016-03-23 DIAGNOSIS — F0390 Unspecified dementia without behavioral disturbance: Secondary | ICD-10-CM | POA: Diagnosis not present

## 2016-03-23 DIAGNOSIS — E119 Type 2 diabetes mellitus without complications: Secondary | ICD-10-CM | POA: Diagnosis not present

## 2016-03-23 DIAGNOSIS — E785 Hyperlipidemia, unspecified: Secondary | ICD-10-CM | POA: Diagnosis not present

## 2016-04-20 DIAGNOSIS — I10 Essential (primary) hypertension: Secondary | ICD-10-CM | POA: Diagnosis not present

## 2016-04-20 DIAGNOSIS — E785 Hyperlipidemia, unspecified: Secondary | ICD-10-CM | POA: Diagnosis not present

## 2016-04-20 DIAGNOSIS — E559 Vitamin D deficiency, unspecified: Secondary | ICD-10-CM | POA: Diagnosis not present

## 2016-04-20 DIAGNOSIS — E119 Type 2 diabetes mellitus without complications: Secondary | ICD-10-CM | POA: Diagnosis not present

## 2016-04-20 DIAGNOSIS — F0390 Unspecified dementia without behavioral disturbance: Secondary | ICD-10-CM | POA: Diagnosis not present

## 2016-04-20 DIAGNOSIS — F331 Major depressive disorder, recurrent, moderate: Secondary | ICD-10-CM | POA: Diagnosis not present

## 2016-05-18 DIAGNOSIS — E119 Type 2 diabetes mellitus without complications: Secondary | ICD-10-CM | POA: Diagnosis not present

## 2016-05-18 DIAGNOSIS — F331 Major depressive disorder, recurrent, moderate: Secondary | ICD-10-CM | POA: Diagnosis not present

## 2016-05-18 DIAGNOSIS — I10 Essential (primary) hypertension: Secondary | ICD-10-CM | POA: Diagnosis not present

## 2016-05-18 DIAGNOSIS — E559 Vitamin D deficiency, unspecified: Secondary | ICD-10-CM | POA: Diagnosis not present

## 2016-05-18 DIAGNOSIS — E785 Hyperlipidemia, unspecified: Secondary | ICD-10-CM | POA: Diagnosis not present

## 2016-05-18 DIAGNOSIS — F0390 Unspecified dementia without behavioral disturbance: Secondary | ICD-10-CM | POA: Diagnosis not present

## 2016-06-13 DIAGNOSIS — E119 Type 2 diabetes mellitus without complications: Secondary | ICD-10-CM | POA: Diagnosis not present

## 2016-06-13 DIAGNOSIS — E559 Vitamin D deficiency, unspecified: Secondary | ICD-10-CM | POA: Diagnosis not present

## 2016-06-13 DIAGNOSIS — I10 Essential (primary) hypertension: Secondary | ICD-10-CM | POA: Diagnosis not present

## 2016-06-13 DIAGNOSIS — E785 Hyperlipidemia, unspecified: Secondary | ICD-10-CM | POA: Diagnosis not present

## 2016-06-20 DIAGNOSIS — E119 Type 2 diabetes mellitus without complications: Secondary | ICD-10-CM | POA: Diagnosis not present

## 2016-06-20 DIAGNOSIS — E559 Vitamin D deficiency, unspecified: Secondary | ICD-10-CM | POA: Diagnosis not present

## 2016-06-20 DIAGNOSIS — E785 Hyperlipidemia, unspecified: Secondary | ICD-10-CM | POA: Diagnosis not present

## 2016-06-20 DIAGNOSIS — F0390 Unspecified dementia without behavioral disturbance: Secondary | ICD-10-CM | POA: Diagnosis not present

## 2016-06-20 DIAGNOSIS — I10 Essential (primary) hypertension: Secondary | ICD-10-CM | POA: Diagnosis not present

## 2016-06-29 DIAGNOSIS — E785 Hyperlipidemia, unspecified: Secondary | ICD-10-CM | POA: Diagnosis not present

## 2016-06-29 DIAGNOSIS — F0390 Unspecified dementia without behavioral disturbance: Secondary | ICD-10-CM | POA: Diagnosis not present

## 2016-06-29 DIAGNOSIS — R296 Repeated falls: Secondary | ICD-10-CM | POA: Diagnosis not present

## 2016-06-29 DIAGNOSIS — E559 Vitamin D deficiency, unspecified: Secondary | ICD-10-CM | POA: Diagnosis not present

## 2016-06-29 DIAGNOSIS — E119 Type 2 diabetes mellitus without complications: Secondary | ICD-10-CM | POA: Diagnosis not present

## 2016-06-29 DIAGNOSIS — I10 Essential (primary) hypertension: Secondary | ICD-10-CM | POA: Diagnosis not present

## 2016-08-03 DIAGNOSIS — R296 Repeated falls: Secondary | ICD-10-CM | POA: Diagnosis not present

## 2016-08-03 DIAGNOSIS — E785 Hyperlipidemia, unspecified: Secondary | ICD-10-CM | POA: Diagnosis not present

## 2016-08-03 DIAGNOSIS — F0390 Unspecified dementia without behavioral disturbance: Secondary | ICD-10-CM | POA: Diagnosis not present

## 2016-08-03 DIAGNOSIS — I10 Essential (primary) hypertension: Secondary | ICD-10-CM | POA: Diagnosis not present

## 2016-08-03 DIAGNOSIS — E119 Type 2 diabetes mellitus without complications: Secondary | ICD-10-CM | POA: Diagnosis not present

## 2016-08-03 DIAGNOSIS — E559 Vitamin D deficiency, unspecified: Secondary | ICD-10-CM | POA: Diagnosis not present

## 2016-09-14 DIAGNOSIS — F0390 Unspecified dementia without behavioral disturbance: Secondary | ICD-10-CM | POA: Diagnosis not present

## 2016-09-14 DIAGNOSIS — E559 Vitamin D deficiency, unspecified: Secondary | ICD-10-CM | POA: Diagnosis not present

## 2016-09-14 DIAGNOSIS — E119 Type 2 diabetes mellitus without complications: Secondary | ICD-10-CM | POA: Diagnosis not present

## 2016-09-14 DIAGNOSIS — R296 Repeated falls: Secondary | ICD-10-CM | POA: Diagnosis not present

## 2016-09-14 DIAGNOSIS — E785 Hyperlipidemia, unspecified: Secondary | ICD-10-CM | POA: Diagnosis not present

## 2016-09-14 DIAGNOSIS — I10 Essential (primary) hypertension: Secondary | ICD-10-CM | POA: Diagnosis not present

## 2016-09-14 DIAGNOSIS — E876 Hypokalemia: Secondary | ICD-10-CM | POA: Diagnosis not present

## 2016-10-11 DIAGNOSIS — E876 Hypokalemia: Secondary | ICD-10-CM | POA: Diagnosis not present

## 2016-10-11 DIAGNOSIS — F331 Major depressive disorder, recurrent, moderate: Secondary | ICD-10-CM | POA: Diagnosis not present

## 2016-10-11 DIAGNOSIS — F0390 Unspecified dementia without behavioral disturbance: Secondary | ICD-10-CM | POA: Diagnosis not present

## 2016-10-11 DIAGNOSIS — E559 Vitamin D deficiency, unspecified: Secondary | ICD-10-CM | POA: Diagnosis not present

## 2016-10-11 DIAGNOSIS — E119 Type 2 diabetes mellitus without complications: Secondary | ICD-10-CM | POA: Diagnosis not present

## 2016-10-19 DIAGNOSIS — Z0000 Encounter for general adult medical examination without abnormal findings: Secondary | ICD-10-CM | POA: Diagnosis not present

## 2016-10-19 DIAGNOSIS — F331 Major depressive disorder, recurrent, moderate: Secondary | ICD-10-CM | POA: Diagnosis not present

## 2016-10-24 DIAGNOSIS — N183 Chronic kidney disease, stage 3 (moderate): Secondary | ICD-10-CM | POA: Diagnosis not present

## 2016-10-24 DIAGNOSIS — E119 Type 2 diabetes mellitus without complications: Secondary | ICD-10-CM | POA: Diagnosis not present

## 2016-10-24 DIAGNOSIS — E876 Hypokalemia: Secondary | ICD-10-CM | POA: Diagnosis not present

## 2016-11-02 DIAGNOSIS — E119 Type 2 diabetes mellitus without complications: Secondary | ICD-10-CM | POA: Diagnosis not present

## 2016-11-02 DIAGNOSIS — F0390 Unspecified dementia without behavioral disturbance: Secondary | ICD-10-CM | POA: Diagnosis not present

## 2016-11-02 DIAGNOSIS — E785 Hyperlipidemia, unspecified: Secondary | ICD-10-CM | POA: Diagnosis not present

## 2016-11-02 DIAGNOSIS — R296 Repeated falls: Secondary | ICD-10-CM | POA: Diagnosis not present

## 2016-11-02 DIAGNOSIS — E876 Hypokalemia: Secondary | ICD-10-CM | POA: Diagnosis not present

## 2016-11-02 DIAGNOSIS — I10 Essential (primary) hypertension: Secondary | ICD-10-CM | POA: Diagnosis not present

## 2016-11-30 DIAGNOSIS — N183 Chronic kidney disease, stage 3 (moderate): Secondary | ICD-10-CM | POA: Diagnosis not present

## 2016-11-30 DIAGNOSIS — E876 Hypokalemia: Secondary | ICD-10-CM | POA: Diagnosis not present

## 2016-11-30 DIAGNOSIS — E785 Hyperlipidemia, unspecified: Secondary | ICD-10-CM | POA: Diagnosis not present

## 2016-11-30 DIAGNOSIS — E119 Type 2 diabetes mellitus without complications: Secondary | ICD-10-CM | POA: Diagnosis not present

## 2016-12-28 DIAGNOSIS — N183 Chronic kidney disease, stage 3 (moderate): Secondary | ICD-10-CM | POA: Diagnosis not present

## 2016-12-28 DIAGNOSIS — E876 Hypokalemia: Secondary | ICD-10-CM | POA: Diagnosis not present

## 2016-12-28 DIAGNOSIS — E119 Type 2 diabetes mellitus without complications: Secondary | ICD-10-CM | POA: Diagnosis not present

## 2016-12-28 DIAGNOSIS — R296 Repeated falls: Secondary | ICD-10-CM | POA: Diagnosis not present

## 2017-01-25 DIAGNOSIS — E119 Type 2 diabetes mellitus without complications: Secondary | ICD-10-CM | POA: Diagnosis not present

## 2017-01-25 DIAGNOSIS — I10 Essential (primary) hypertension: Secondary | ICD-10-CM | POA: Diagnosis not present

## 2017-01-25 DIAGNOSIS — N183 Chronic kidney disease, stage 3 (moderate): Secondary | ICD-10-CM | POA: Diagnosis not present

## 2017-01-30 DIAGNOSIS — E119 Type 2 diabetes mellitus without complications: Secondary | ICD-10-CM | POA: Diagnosis not present

## 2017-01-30 DIAGNOSIS — N183 Chronic kidney disease, stage 3 (moderate): Secondary | ICD-10-CM | POA: Diagnosis not present

## 2017-01-30 DIAGNOSIS — E876 Hypokalemia: Secondary | ICD-10-CM | POA: Diagnosis not present

## 2017-02-22 DIAGNOSIS — E119 Type 2 diabetes mellitus without complications: Secondary | ICD-10-CM | POA: Diagnosis not present

## 2017-02-22 DIAGNOSIS — I10 Essential (primary) hypertension: Secondary | ICD-10-CM | POA: Diagnosis not present

## 2017-02-22 DIAGNOSIS — E782 Mixed hyperlipidemia: Secondary | ICD-10-CM | POA: Diagnosis not present

## 2017-03-22 DIAGNOSIS — E876 Hypokalemia: Secondary | ICD-10-CM | POA: Diagnosis not present

## 2017-03-22 DIAGNOSIS — R634 Abnormal weight loss: Secondary | ICD-10-CM | POA: Diagnosis not present

## 2017-03-22 DIAGNOSIS — Z7982 Long term (current) use of aspirin: Secondary | ICD-10-CM | POA: Diagnosis not present

## 2017-03-28 DIAGNOSIS — F331 Major depressive disorder, recurrent, moderate: Secondary | ICD-10-CM | POA: Diagnosis not present

## 2017-03-28 DIAGNOSIS — F0390 Unspecified dementia without behavioral disturbance: Secondary | ICD-10-CM | POA: Diagnosis not present

## 2017-04-26 DIAGNOSIS — R634 Abnormal weight loss: Secondary | ICD-10-CM | POA: Diagnosis not present

## 2017-04-26 DIAGNOSIS — F331 Major depressive disorder, recurrent, moderate: Secondary | ICD-10-CM | POA: Diagnosis not present

## 2017-04-26 DIAGNOSIS — N183 Chronic kidney disease, stage 3 (moderate): Secondary | ICD-10-CM | POA: Diagnosis not present

## 2017-04-26 DIAGNOSIS — F0390 Unspecified dementia without behavioral disturbance: Secondary | ICD-10-CM | POA: Diagnosis not present

## 2017-05-15 DIAGNOSIS — E119 Type 2 diabetes mellitus without complications: Secondary | ICD-10-CM | POA: Diagnosis not present

## 2017-05-15 DIAGNOSIS — E782 Mixed hyperlipidemia: Secondary | ICD-10-CM | POA: Diagnosis not present

## 2017-05-15 DIAGNOSIS — E876 Hypokalemia: Secondary | ICD-10-CM | POA: Diagnosis not present

## 2017-05-15 DIAGNOSIS — N183 Chronic kidney disease, stage 3 (moderate): Secondary | ICD-10-CM | POA: Diagnosis not present

## 2017-05-23 DIAGNOSIS — F0390 Unspecified dementia without behavioral disturbance: Secondary | ICD-10-CM | POA: Diagnosis not present

## 2017-05-23 DIAGNOSIS — F331 Major depressive disorder, recurrent, moderate: Secondary | ICD-10-CM | POA: Diagnosis not present

## 2017-05-24 DIAGNOSIS — N183 Chronic kidney disease, stage 3 (moderate): Secondary | ICD-10-CM | POA: Diagnosis not present

## 2017-05-24 DIAGNOSIS — E782 Mixed hyperlipidemia: Secondary | ICD-10-CM | POA: Diagnosis not present

## 2017-05-24 DIAGNOSIS — I10 Essential (primary) hypertension: Secondary | ICD-10-CM | POA: Diagnosis not present

## 2017-06-21 DIAGNOSIS — E876 Hypokalemia: Secondary | ICD-10-CM | POA: Diagnosis not present

## 2017-06-21 DIAGNOSIS — F0390 Unspecified dementia without behavioral disturbance: Secondary | ICD-10-CM | POA: Diagnosis not present

## 2017-06-21 DIAGNOSIS — Z7982 Long term (current) use of aspirin: Secondary | ICD-10-CM | POA: Diagnosis not present

## 2017-06-27 DIAGNOSIS — F331 Major depressive disorder, recurrent, moderate: Secondary | ICD-10-CM | POA: Diagnosis not present

## 2017-06-27 DIAGNOSIS — F0390 Unspecified dementia without behavioral disturbance: Secondary | ICD-10-CM | POA: Diagnosis not present

## 2017-07-11 DIAGNOSIS — E119 Type 2 diabetes mellitus without complications: Secondary | ICD-10-CM | POA: Diagnosis not present

## 2017-07-11 DIAGNOSIS — F331 Major depressive disorder, recurrent, moderate: Secondary | ICD-10-CM | POA: Diagnosis not present

## 2017-07-11 DIAGNOSIS — E876 Hypokalemia: Secondary | ICD-10-CM | POA: Diagnosis not present

## 2017-07-11 DIAGNOSIS — F0390 Unspecified dementia without behavioral disturbance: Secondary | ICD-10-CM | POA: Diagnosis not present

## 2017-08-02 DIAGNOSIS — F0390 Unspecified dementia without behavioral disturbance: Secondary | ICD-10-CM | POA: Diagnosis not present

## 2017-08-02 DIAGNOSIS — E782 Mixed hyperlipidemia: Secondary | ICD-10-CM | POA: Diagnosis not present

## 2017-08-02 DIAGNOSIS — I10 Essential (primary) hypertension: Secondary | ICD-10-CM | POA: Diagnosis not present

## 2017-08-02 DIAGNOSIS — E119 Type 2 diabetes mellitus without complications: Secondary | ICD-10-CM | POA: Diagnosis not present

## 2017-08-28 DIAGNOSIS — E119 Type 2 diabetes mellitus without complications: Secondary | ICD-10-CM | POA: Diagnosis not present

## 2017-08-28 DIAGNOSIS — I10 Essential (primary) hypertension: Secondary | ICD-10-CM | POA: Diagnosis not present

## 2017-08-28 DIAGNOSIS — E782 Mixed hyperlipidemia: Secondary | ICD-10-CM | POA: Diagnosis not present

## 2017-08-28 DIAGNOSIS — R634 Abnormal weight loss: Secondary | ICD-10-CM | POA: Diagnosis not present

## 2017-08-30 DIAGNOSIS — E876 Hypokalemia: Secondary | ICD-10-CM | POA: Diagnosis not present

## 2017-08-30 DIAGNOSIS — I10 Essential (primary) hypertension: Secondary | ICD-10-CM | POA: Diagnosis not present

## 2017-08-30 DIAGNOSIS — N183 Chronic kidney disease, stage 3 (moderate): Secondary | ICD-10-CM | POA: Diagnosis not present

## 2017-08-30 DIAGNOSIS — E119 Type 2 diabetes mellitus without complications: Secondary | ICD-10-CM | POA: Diagnosis not present

## 2017-09-25 DIAGNOSIS — F0280 Dementia in other diseases classified elsewhere without behavioral disturbance: Secondary | ICD-10-CM | POA: Diagnosis not present

## 2017-09-25 DIAGNOSIS — F331 Major depressive disorder, recurrent, moderate: Secondary | ICD-10-CM | POA: Diagnosis not present

## 2017-09-25 DIAGNOSIS — G301 Alzheimer's disease with late onset: Secondary | ICD-10-CM | POA: Diagnosis not present

## 2017-09-27 DIAGNOSIS — Z7982 Long term (current) use of aspirin: Secondary | ICD-10-CM | POA: Diagnosis not present

## 2017-09-27 DIAGNOSIS — E119 Type 2 diabetes mellitus without complications: Secondary | ICD-10-CM | POA: Diagnosis not present

## 2017-09-27 DIAGNOSIS — R634 Abnormal weight loss: Secondary | ICD-10-CM | POA: Diagnosis not present

## 2017-09-27 DIAGNOSIS — I10 Essential (primary) hypertension: Secondary | ICD-10-CM | POA: Diagnosis not present

## 2017-10-25 DIAGNOSIS — Z0000 Encounter for general adult medical examination without abnormal findings: Secondary | ICD-10-CM | POA: Diagnosis not present

## 2017-11-29 DIAGNOSIS — E782 Mixed hyperlipidemia: Secondary | ICD-10-CM | POA: Diagnosis not present

## 2017-11-29 DIAGNOSIS — M6281 Muscle weakness (generalized): Secondary | ICD-10-CM | POA: Diagnosis not present

## 2017-11-29 DIAGNOSIS — I10 Essential (primary) hypertension: Secondary | ICD-10-CM | POA: Diagnosis not present

## 2017-11-29 DIAGNOSIS — R2681 Unsteadiness on feet: Secondary | ICD-10-CM | POA: Diagnosis not present

## 2017-12-03 DIAGNOSIS — I129 Hypertensive chronic kidney disease with stage 1 through stage 4 chronic kidney disease, or unspecified chronic kidney disease: Secondary | ICD-10-CM | POA: Diagnosis not present

## 2017-12-03 DIAGNOSIS — E1122 Type 2 diabetes mellitus with diabetic chronic kidney disease: Secondary | ICD-10-CM | POA: Diagnosis not present

## 2017-12-03 DIAGNOSIS — N183 Chronic kidney disease, stage 3 (moderate): Secondary | ICD-10-CM | POA: Diagnosis not present

## 2017-12-03 DIAGNOSIS — F0390 Unspecified dementia without behavioral disturbance: Secondary | ICD-10-CM | POA: Diagnosis not present

## 2017-12-03 DIAGNOSIS — F329 Major depressive disorder, single episode, unspecified: Secondary | ICD-10-CM | POA: Diagnosis not present

## 2017-12-03 DIAGNOSIS — E785 Hyperlipidemia, unspecified: Secondary | ICD-10-CM | POA: Diagnosis not present

## 2017-12-11 DIAGNOSIS — I10 Essential (primary) hypertension: Secondary | ICD-10-CM | POA: Diagnosis not present

## 2017-12-11 DIAGNOSIS — E119 Type 2 diabetes mellitus without complications: Secondary | ICD-10-CM | POA: Diagnosis not present

## 2017-12-11 DIAGNOSIS — E782 Mixed hyperlipidemia: Secondary | ICD-10-CM | POA: Diagnosis not present

## 2017-12-11 DIAGNOSIS — E876 Hypokalemia: Secondary | ICD-10-CM | POA: Diagnosis not present

## 2018-01-03 DIAGNOSIS — Z7982 Long term (current) use of aspirin: Secondary | ICD-10-CM | POA: Diagnosis not present

## 2018-01-03 DIAGNOSIS — N183 Chronic kidney disease, stage 3 (moderate): Secondary | ICD-10-CM | POA: Diagnosis not present

## 2018-01-03 DIAGNOSIS — R2681 Unsteadiness on feet: Secondary | ICD-10-CM | POA: Diagnosis not present

## 2018-01-03 DIAGNOSIS — E876 Hypokalemia: Secondary | ICD-10-CM | POA: Diagnosis not present

## 2018-02-07 DIAGNOSIS — R2681 Unsteadiness on feet: Secondary | ICD-10-CM | POA: Diagnosis not present

## 2018-02-07 DIAGNOSIS — E119 Type 2 diabetes mellitus without complications: Secondary | ICD-10-CM | POA: Diagnosis not present

## 2018-02-07 DIAGNOSIS — R634 Abnormal weight loss: Secondary | ICD-10-CM | POA: Diagnosis not present

## 2018-02-07 DIAGNOSIS — N183 Chronic kidney disease, stage 3 (moderate): Secondary | ICD-10-CM | POA: Diagnosis not present

## 2018-03-01 DIAGNOSIS — F0390 Unspecified dementia without behavioral disturbance: Secondary | ICD-10-CM | POA: Diagnosis not present

## 2018-03-01 DIAGNOSIS — M6281 Muscle weakness (generalized): Secondary | ICD-10-CM | POA: Diagnosis not present

## 2018-03-01 DIAGNOSIS — N183 Chronic kidney disease, stage 3 (moderate): Secondary | ICD-10-CM | POA: Diagnosis not present

## 2018-03-01 DIAGNOSIS — I10 Essential (primary) hypertension: Secondary | ICD-10-CM | POA: Diagnosis not present

## 2018-03-01 DIAGNOSIS — D649 Anemia, unspecified: Secondary | ICD-10-CM | POA: Diagnosis not present

## 2018-03-01 DIAGNOSIS — R41841 Cognitive communication deficit: Secondary | ICD-10-CM | POA: Diagnosis not present

## 2018-03-01 DIAGNOSIS — F419 Anxiety disorder, unspecified: Secondary | ICD-10-CM | POA: Diagnosis not present

## 2018-03-01 DIAGNOSIS — F339 Major depressive disorder, recurrent, unspecified: Secondary | ICD-10-CM | POA: Diagnosis not present

## 2018-03-01 DIAGNOSIS — R278 Other lack of coordination: Secondary | ICD-10-CM | POA: Diagnosis not present

## 2018-03-02 DIAGNOSIS — M6281 Muscle weakness (generalized): Secondary | ICD-10-CM | POA: Diagnosis not present

## 2018-03-02 DIAGNOSIS — N183 Chronic kidney disease, stage 3 (moderate): Secondary | ICD-10-CM | POA: Diagnosis not present

## 2018-03-02 DIAGNOSIS — I10 Essential (primary) hypertension: Secondary | ICD-10-CM | POA: Diagnosis not present

## 2018-03-02 DIAGNOSIS — F339 Major depressive disorder, recurrent, unspecified: Secondary | ICD-10-CM | POA: Diagnosis not present

## 2018-03-02 DIAGNOSIS — F419 Anxiety disorder, unspecified: Secondary | ICD-10-CM | POA: Diagnosis not present

## 2018-03-02 DIAGNOSIS — R278 Other lack of coordination: Secondary | ICD-10-CM | POA: Diagnosis not present

## 2018-03-02 DIAGNOSIS — F0390 Unspecified dementia without behavioral disturbance: Secondary | ICD-10-CM | POA: Diagnosis not present

## 2018-03-02 DIAGNOSIS — R41841 Cognitive communication deficit: Secondary | ICD-10-CM | POA: Diagnosis not present

## 2018-03-03 DIAGNOSIS — F339 Major depressive disorder, recurrent, unspecified: Secondary | ICD-10-CM | POA: Diagnosis not present

## 2018-03-03 DIAGNOSIS — F419 Anxiety disorder, unspecified: Secondary | ICD-10-CM | POA: Diagnosis not present

## 2018-03-03 DIAGNOSIS — F0390 Unspecified dementia without behavioral disturbance: Secondary | ICD-10-CM | POA: Diagnosis not present

## 2018-03-03 DIAGNOSIS — N183 Chronic kidney disease, stage 3 (moderate): Secondary | ICD-10-CM | POA: Diagnosis not present

## 2018-03-03 DIAGNOSIS — E119 Type 2 diabetes mellitus without complications: Secondary | ICD-10-CM | POA: Diagnosis not present

## 2018-03-03 DIAGNOSIS — R41841 Cognitive communication deficit: Secondary | ICD-10-CM | POA: Diagnosis not present

## 2018-03-03 DIAGNOSIS — R278 Other lack of coordination: Secondary | ICD-10-CM | POA: Diagnosis not present

## 2018-03-03 DIAGNOSIS — M6281 Muscle weakness (generalized): Secondary | ICD-10-CM | POA: Diagnosis not present

## 2018-03-03 DIAGNOSIS — I10 Essential (primary) hypertension: Secondary | ICD-10-CM | POA: Diagnosis not present

## 2018-03-04 DIAGNOSIS — R278 Other lack of coordination: Secondary | ICD-10-CM | POA: Diagnosis not present

## 2018-03-04 DIAGNOSIS — F339 Major depressive disorder, recurrent, unspecified: Secondary | ICD-10-CM | POA: Diagnosis not present

## 2018-03-04 DIAGNOSIS — F0390 Unspecified dementia without behavioral disturbance: Secondary | ICD-10-CM | POA: Diagnosis not present

## 2018-03-04 DIAGNOSIS — N183 Chronic kidney disease, stage 3 (moderate): Secondary | ICD-10-CM | POA: Diagnosis not present

## 2018-03-04 DIAGNOSIS — M6281 Muscle weakness (generalized): Secondary | ICD-10-CM | POA: Diagnosis not present

## 2018-03-04 DIAGNOSIS — F419 Anxiety disorder, unspecified: Secondary | ICD-10-CM | POA: Diagnosis not present

## 2018-03-04 DIAGNOSIS — I10 Essential (primary) hypertension: Secondary | ICD-10-CM | POA: Diagnosis not present

## 2018-03-04 DIAGNOSIS — R41841 Cognitive communication deficit: Secondary | ICD-10-CM | POA: Diagnosis not present

## 2018-03-05 DIAGNOSIS — F331 Major depressive disorder, recurrent, moderate: Secondary | ICD-10-CM | POA: Diagnosis not present

## 2018-03-05 DIAGNOSIS — R41841 Cognitive communication deficit: Secondary | ICD-10-CM | POA: Diagnosis not present

## 2018-03-05 DIAGNOSIS — G301 Alzheimer's disease with late onset: Secondary | ICD-10-CM | POA: Diagnosis not present

## 2018-03-05 DIAGNOSIS — N183 Chronic kidney disease, stage 3 (moderate): Secondary | ICD-10-CM | POA: Diagnosis not present

## 2018-03-05 DIAGNOSIS — I10 Essential (primary) hypertension: Secondary | ICD-10-CM | POA: Diagnosis not present

## 2018-03-05 DIAGNOSIS — M6281 Muscle weakness (generalized): Secondary | ICD-10-CM | POA: Diagnosis not present

## 2018-03-05 DIAGNOSIS — F419 Anxiety disorder, unspecified: Secondary | ICD-10-CM | POA: Diagnosis not present

## 2018-03-05 DIAGNOSIS — F0280 Dementia in other diseases classified elsewhere without behavioral disturbance: Secondary | ICD-10-CM | POA: Diagnosis not present

## 2018-03-05 DIAGNOSIS — F339 Major depressive disorder, recurrent, unspecified: Secondary | ICD-10-CM | POA: Diagnosis not present

## 2018-03-05 DIAGNOSIS — F0390 Unspecified dementia without behavioral disturbance: Secondary | ICD-10-CM | POA: Diagnosis not present

## 2018-03-05 DIAGNOSIS — R278 Other lack of coordination: Secondary | ICD-10-CM | POA: Diagnosis not present

## 2018-03-06 DIAGNOSIS — N183 Chronic kidney disease, stage 3 (moderate): Secondary | ICD-10-CM | POA: Diagnosis not present

## 2018-03-06 DIAGNOSIS — F0390 Unspecified dementia without behavioral disturbance: Secondary | ICD-10-CM | POA: Diagnosis not present

## 2018-03-06 DIAGNOSIS — F419 Anxiety disorder, unspecified: Secondary | ICD-10-CM | POA: Diagnosis not present

## 2018-03-06 DIAGNOSIS — R41841 Cognitive communication deficit: Secondary | ICD-10-CM | POA: Diagnosis not present

## 2018-03-06 DIAGNOSIS — F339 Major depressive disorder, recurrent, unspecified: Secondary | ICD-10-CM | POA: Diagnosis not present

## 2018-03-06 DIAGNOSIS — M6281 Muscle weakness (generalized): Secondary | ICD-10-CM | POA: Diagnosis not present

## 2018-03-06 DIAGNOSIS — I10 Essential (primary) hypertension: Secondary | ICD-10-CM | POA: Diagnosis not present

## 2018-03-06 DIAGNOSIS — E46 Unspecified protein-calorie malnutrition: Secondary | ICD-10-CM | POA: Diagnosis not present

## 2018-03-06 DIAGNOSIS — R278 Other lack of coordination: Secondary | ICD-10-CM | POA: Diagnosis not present

## 2018-03-07 DIAGNOSIS — I10 Essential (primary) hypertension: Secondary | ICD-10-CM | POA: Diagnosis not present

## 2018-03-07 DIAGNOSIS — F419 Anxiety disorder, unspecified: Secondary | ICD-10-CM | POA: Diagnosis not present

## 2018-03-07 DIAGNOSIS — R278 Other lack of coordination: Secondary | ICD-10-CM | POA: Diagnosis not present

## 2018-03-07 DIAGNOSIS — N183 Chronic kidney disease, stage 3 (moderate): Secondary | ICD-10-CM | POA: Diagnosis not present

## 2018-03-07 DIAGNOSIS — M6281 Muscle weakness (generalized): Secondary | ICD-10-CM | POA: Diagnosis not present

## 2018-03-07 DIAGNOSIS — F0390 Unspecified dementia without behavioral disturbance: Secondary | ICD-10-CM | POA: Diagnosis not present

## 2018-03-07 DIAGNOSIS — R41841 Cognitive communication deficit: Secondary | ICD-10-CM | POA: Diagnosis not present

## 2018-03-07 DIAGNOSIS — F339 Major depressive disorder, recurrent, unspecified: Secondary | ICD-10-CM | POA: Diagnosis not present

## 2018-03-10 DIAGNOSIS — R278 Other lack of coordination: Secondary | ICD-10-CM | POA: Diagnosis not present

## 2018-03-10 DIAGNOSIS — M6281 Muscle weakness (generalized): Secondary | ICD-10-CM | POA: Diagnosis not present

## 2018-03-10 DIAGNOSIS — R41841 Cognitive communication deficit: Secondary | ICD-10-CM | POA: Diagnosis not present

## 2018-03-10 DIAGNOSIS — I10 Essential (primary) hypertension: Secondary | ICD-10-CM | POA: Diagnosis not present

## 2018-03-10 DIAGNOSIS — F339 Major depressive disorder, recurrent, unspecified: Secondary | ICD-10-CM | POA: Diagnosis not present

## 2018-03-10 DIAGNOSIS — F0390 Unspecified dementia without behavioral disturbance: Secondary | ICD-10-CM | POA: Diagnosis not present

## 2018-03-10 DIAGNOSIS — F419 Anxiety disorder, unspecified: Secondary | ICD-10-CM | POA: Diagnosis not present

## 2018-03-10 DIAGNOSIS — N183 Chronic kidney disease, stage 3 (moderate): Secondary | ICD-10-CM | POA: Diagnosis not present

## 2018-03-11 DIAGNOSIS — M6281 Muscle weakness (generalized): Secondary | ICD-10-CM | POA: Diagnosis not present

## 2018-03-11 DIAGNOSIS — R278 Other lack of coordination: Secondary | ICD-10-CM | POA: Diagnosis not present

## 2018-03-11 DIAGNOSIS — I10 Essential (primary) hypertension: Secondary | ICD-10-CM | POA: Diagnosis not present

## 2018-03-11 DIAGNOSIS — R41841 Cognitive communication deficit: Secondary | ICD-10-CM | POA: Diagnosis not present

## 2018-03-11 DIAGNOSIS — F0390 Unspecified dementia without behavioral disturbance: Secondary | ICD-10-CM | POA: Diagnosis not present

## 2018-03-11 DIAGNOSIS — F339 Major depressive disorder, recurrent, unspecified: Secondary | ICD-10-CM | POA: Diagnosis not present

## 2018-03-11 DIAGNOSIS — N183 Chronic kidney disease, stage 3 (moderate): Secondary | ICD-10-CM | POA: Diagnosis not present

## 2018-03-11 DIAGNOSIS — F419 Anxiety disorder, unspecified: Secondary | ICD-10-CM | POA: Diagnosis not present

## 2018-03-12 DIAGNOSIS — N183 Chronic kidney disease, stage 3 (moderate): Secondary | ICD-10-CM | POA: Diagnosis not present

## 2018-03-12 DIAGNOSIS — I10 Essential (primary) hypertension: Secondary | ICD-10-CM | POA: Diagnosis not present

## 2018-03-12 DIAGNOSIS — F419 Anxiety disorder, unspecified: Secondary | ICD-10-CM | POA: Diagnosis not present

## 2018-03-12 DIAGNOSIS — R41841 Cognitive communication deficit: Secondary | ICD-10-CM | POA: Diagnosis not present

## 2018-03-12 DIAGNOSIS — F339 Major depressive disorder, recurrent, unspecified: Secondary | ICD-10-CM | POA: Diagnosis not present

## 2018-03-12 DIAGNOSIS — R278 Other lack of coordination: Secondary | ICD-10-CM | POA: Diagnosis not present

## 2018-03-12 DIAGNOSIS — F0390 Unspecified dementia without behavioral disturbance: Secondary | ICD-10-CM | POA: Diagnosis not present

## 2018-03-12 DIAGNOSIS — M6281 Muscle weakness (generalized): Secondary | ICD-10-CM | POA: Diagnosis not present

## 2018-03-13 DIAGNOSIS — I10 Essential (primary) hypertension: Secondary | ICD-10-CM | POA: Diagnosis not present

## 2018-03-13 DIAGNOSIS — F419 Anxiety disorder, unspecified: Secondary | ICD-10-CM | POA: Diagnosis not present

## 2018-03-13 DIAGNOSIS — M6281 Muscle weakness (generalized): Secondary | ICD-10-CM | POA: Diagnosis not present

## 2018-03-13 DIAGNOSIS — R41841 Cognitive communication deficit: Secondary | ICD-10-CM | POA: Diagnosis not present

## 2018-03-13 DIAGNOSIS — R278 Other lack of coordination: Secondary | ICD-10-CM | POA: Diagnosis not present

## 2018-03-13 DIAGNOSIS — F0390 Unspecified dementia without behavioral disturbance: Secondary | ICD-10-CM | POA: Diagnosis not present

## 2018-03-13 DIAGNOSIS — N183 Chronic kidney disease, stage 3 (moderate): Secondary | ICD-10-CM | POA: Diagnosis not present

## 2018-03-13 DIAGNOSIS — F339 Major depressive disorder, recurrent, unspecified: Secondary | ICD-10-CM | POA: Diagnosis not present

## 2018-03-14 DIAGNOSIS — N183 Chronic kidney disease, stage 3 (moderate): Secondary | ICD-10-CM | POA: Diagnosis not present

## 2018-03-14 DIAGNOSIS — M6281 Muscle weakness (generalized): Secondary | ICD-10-CM | POA: Diagnosis not present

## 2018-03-14 DIAGNOSIS — I10 Essential (primary) hypertension: Secondary | ICD-10-CM | POA: Diagnosis not present

## 2018-03-14 DIAGNOSIS — R41841 Cognitive communication deficit: Secondary | ICD-10-CM | POA: Diagnosis not present

## 2018-03-14 DIAGNOSIS — F339 Major depressive disorder, recurrent, unspecified: Secondary | ICD-10-CM | POA: Diagnosis not present

## 2018-03-14 DIAGNOSIS — R278 Other lack of coordination: Secondary | ICD-10-CM | POA: Diagnosis not present

## 2018-03-14 DIAGNOSIS — F419 Anxiety disorder, unspecified: Secondary | ICD-10-CM | POA: Diagnosis not present

## 2018-03-14 DIAGNOSIS — F0390 Unspecified dementia without behavioral disturbance: Secondary | ICD-10-CM | POA: Diagnosis not present

## 2018-03-15 DIAGNOSIS — R41841 Cognitive communication deficit: Secondary | ICD-10-CM | POA: Diagnosis not present

## 2018-03-15 DIAGNOSIS — N183 Chronic kidney disease, stage 3 (moderate): Secondary | ICD-10-CM | POA: Diagnosis not present

## 2018-03-15 DIAGNOSIS — I10 Essential (primary) hypertension: Secondary | ICD-10-CM | POA: Diagnosis not present

## 2018-03-15 DIAGNOSIS — F0390 Unspecified dementia without behavioral disturbance: Secondary | ICD-10-CM | POA: Diagnosis not present

## 2018-03-15 DIAGNOSIS — R278 Other lack of coordination: Secondary | ICD-10-CM | POA: Diagnosis not present

## 2018-03-15 DIAGNOSIS — F339 Major depressive disorder, recurrent, unspecified: Secondary | ICD-10-CM | POA: Diagnosis not present

## 2018-03-15 DIAGNOSIS — F419 Anxiety disorder, unspecified: Secondary | ICD-10-CM | POA: Diagnosis not present

## 2018-03-15 DIAGNOSIS — M6281 Muscle weakness (generalized): Secondary | ICD-10-CM | POA: Diagnosis not present

## 2018-03-17 DIAGNOSIS — R41841 Cognitive communication deficit: Secondary | ICD-10-CM | POA: Diagnosis not present

## 2018-03-17 DIAGNOSIS — F0390 Unspecified dementia without behavioral disturbance: Secondary | ICD-10-CM | POA: Diagnosis not present

## 2018-03-17 DIAGNOSIS — F419 Anxiety disorder, unspecified: Secondary | ICD-10-CM | POA: Diagnosis not present

## 2018-03-17 DIAGNOSIS — N183 Chronic kidney disease, stage 3 (moderate): Secondary | ICD-10-CM | POA: Diagnosis not present

## 2018-03-17 DIAGNOSIS — R278 Other lack of coordination: Secondary | ICD-10-CM | POA: Diagnosis not present

## 2018-03-17 DIAGNOSIS — F339 Major depressive disorder, recurrent, unspecified: Secondary | ICD-10-CM | POA: Diagnosis not present

## 2018-03-17 DIAGNOSIS — M6281 Muscle weakness (generalized): Secondary | ICD-10-CM | POA: Diagnosis not present

## 2018-03-17 DIAGNOSIS — I10 Essential (primary) hypertension: Secondary | ICD-10-CM | POA: Diagnosis not present

## 2018-03-18 DIAGNOSIS — N183 Chronic kidney disease, stage 3 (moderate): Secondary | ICD-10-CM | POA: Diagnosis not present

## 2018-03-18 DIAGNOSIS — R41841 Cognitive communication deficit: Secondary | ICD-10-CM | POA: Diagnosis not present

## 2018-03-18 DIAGNOSIS — F419 Anxiety disorder, unspecified: Secondary | ICD-10-CM | POA: Diagnosis not present

## 2018-03-18 DIAGNOSIS — M6281 Muscle weakness (generalized): Secondary | ICD-10-CM | POA: Diagnosis not present

## 2018-03-18 DIAGNOSIS — F0390 Unspecified dementia without behavioral disturbance: Secondary | ICD-10-CM | POA: Diagnosis not present

## 2018-03-18 DIAGNOSIS — F339 Major depressive disorder, recurrent, unspecified: Secondary | ICD-10-CM | POA: Diagnosis not present

## 2018-03-18 DIAGNOSIS — I10 Essential (primary) hypertension: Secondary | ICD-10-CM | POA: Diagnosis not present

## 2018-03-18 DIAGNOSIS — R278 Other lack of coordination: Secondary | ICD-10-CM | POA: Diagnosis not present

## 2018-04-02 DIAGNOSIS — F064 Anxiety disorder due to known physiological condition: Secondary | ICD-10-CM | POA: Diagnosis not present

## 2018-04-02 DIAGNOSIS — G301 Alzheimer's disease with late onset: Secondary | ICD-10-CM | POA: Diagnosis not present

## 2018-04-02 DIAGNOSIS — F0280 Dementia in other diseases classified elsewhere without behavioral disturbance: Secondary | ICD-10-CM | POA: Diagnosis not present

## 2018-04-02 DIAGNOSIS — F331 Major depressive disorder, recurrent, moderate: Secondary | ICD-10-CM | POA: Diagnosis not present

## 2018-04-04 DIAGNOSIS — N183 Chronic kidney disease, stage 3 (moderate): Secondary | ICD-10-CM | POA: Diagnosis not present

## 2018-04-04 DIAGNOSIS — F0391 Unspecified dementia with behavioral disturbance: Secondary | ICD-10-CM | POA: Diagnosis not present

## 2018-04-04 DIAGNOSIS — R2689 Other abnormalities of gait and mobility: Secondary | ICD-10-CM | POA: Diagnosis not present

## 2018-04-04 DIAGNOSIS — F322 Major depressive disorder, single episode, severe without psychotic features: Secondary | ICD-10-CM | POA: Diagnosis not present

## 2018-04-09 DIAGNOSIS — I10 Essential (primary) hypertension: Secondary | ICD-10-CM | POA: Diagnosis not present

## 2018-04-09 DIAGNOSIS — R350 Frequency of micturition: Secondary | ICD-10-CM | POA: Diagnosis not present

## 2018-04-09 DIAGNOSIS — R32 Unspecified urinary incontinence: Secondary | ICD-10-CM | POA: Diagnosis not present

## 2018-04-09 DIAGNOSIS — N183 Chronic kidney disease, stage 3 (moderate): Secondary | ICD-10-CM | POA: Diagnosis not present

## 2018-04-10 DIAGNOSIS — Z79899 Other long term (current) drug therapy: Secondary | ICD-10-CM | POA: Diagnosis not present

## 2018-04-10 DIAGNOSIS — R319 Hematuria, unspecified: Secondary | ICD-10-CM | POA: Diagnosis not present

## 2018-05-07 DIAGNOSIS — N183 Chronic kidney disease, stage 3 (moderate): Secondary | ICD-10-CM | POA: Diagnosis not present

## 2018-05-07 DIAGNOSIS — F0390 Unspecified dementia without behavioral disturbance: Secondary | ICD-10-CM | POA: Diagnosis not present

## 2018-05-07 DIAGNOSIS — F322 Major depressive disorder, single episode, severe without psychotic features: Secondary | ICD-10-CM | POA: Diagnosis not present

## 2018-05-07 DIAGNOSIS — I10 Essential (primary) hypertension: Secondary | ICD-10-CM | POA: Diagnosis not present

## 2018-06-06 DIAGNOSIS — F0391 Unspecified dementia with behavioral disturbance: Secondary | ICD-10-CM | POA: Diagnosis not present

## 2018-06-06 DIAGNOSIS — I10 Essential (primary) hypertension: Secondary | ICD-10-CM | POA: Diagnosis not present

## 2018-06-06 DIAGNOSIS — N183 Chronic kidney disease, stage 3 (moderate): Secondary | ICD-10-CM | POA: Diagnosis not present

## 2018-06-06 DIAGNOSIS — E46 Unspecified protein-calorie malnutrition: Secondary | ICD-10-CM | POA: Diagnosis not present

## 2018-06-30 DIAGNOSIS — E785 Hyperlipidemia, unspecified: Secondary | ICD-10-CM | POA: Diagnosis not present

## 2018-06-30 DIAGNOSIS — E119 Type 2 diabetes mellitus without complications: Secondary | ICD-10-CM | POA: Diagnosis not present

## 2018-06-30 DIAGNOSIS — I10 Essential (primary) hypertension: Secondary | ICD-10-CM | POA: Diagnosis not present

## 2018-06-30 DIAGNOSIS — F339 Major depressive disorder, recurrent, unspecified: Secondary | ICD-10-CM | POA: Diagnosis not present

## 2018-07-04 DIAGNOSIS — F322 Major depressive disorder, single episode, severe without psychotic features: Secondary | ICD-10-CM | POA: Diagnosis not present

## 2018-07-04 DIAGNOSIS — I10 Essential (primary) hypertension: Secondary | ICD-10-CM | POA: Diagnosis not present

## 2018-07-04 DIAGNOSIS — N183 Chronic kidney disease, stage 3 (moderate): Secondary | ICD-10-CM | POA: Diagnosis not present

## 2018-07-04 DIAGNOSIS — F0390 Unspecified dementia without behavioral disturbance: Secondary | ICD-10-CM | POA: Diagnosis not present

## 2018-07-24 DIAGNOSIS — F0390 Unspecified dementia without behavioral disturbance: Secondary | ICD-10-CM | POA: Diagnosis not present

## 2018-07-24 DIAGNOSIS — E119 Type 2 diabetes mellitus without complications: Secondary | ICD-10-CM | POA: Diagnosis not present

## 2018-07-24 DIAGNOSIS — R112 Nausea with vomiting, unspecified: Secondary | ICD-10-CM | POA: Diagnosis not present

## 2018-07-24 DIAGNOSIS — I10 Essential (primary) hypertension: Secondary | ICD-10-CM | POA: Diagnosis not present

## 2018-07-24 DIAGNOSIS — R110 Nausea: Secondary | ICD-10-CM | POA: Diagnosis not present

## 2018-08-12 DIAGNOSIS — F0390 Unspecified dementia without behavioral disturbance: Secondary | ICD-10-CM | POA: Diagnosis not present

## 2018-08-12 DIAGNOSIS — F322 Major depressive disorder, single episode, severe without psychotic features: Secondary | ICD-10-CM | POA: Diagnosis not present

## 2018-08-12 DIAGNOSIS — E46 Unspecified protein-calorie malnutrition: Secondary | ICD-10-CM | POA: Diagnosis not present

## 2018-08-12 DIAGNOSIS — I10 Essential (primary) hypertension: Secondary | ICD-10-CM | POA: Diagnosis not present

## 2018-08-13 DIAGNOSIS — R4181 Age-related cognitive decline: Secondary | ICD-10-CM | POA: Diagnosis not present

## 2018-08-13 DIAGNOSIS — F339 Major depressive disorder, recurrent, unspecified: Secondary | ICD-10-CM | POA: Diagnosis not present

## 2018-08-13 DIAGNOSIS — I10 Essential (primary) hypertension: Secondary | ICD-10-CM | POA: Diagnosis not present

## 2018-08-13 DIAGNOSIS — F419 Anxiety disorder, unspecified: Secondary | ICD-10-CM | POA: Diagnosis not present

## 2018-08-13 DIAGNOSIS — R41841 Cognitive communication deficit: Secondary | ICD-10-CM | POA: Diagnosis not present

## 2018-08-13 DIAGNOSIS — F0390 Unspecified dementia without behavioral disturbance: Secondary | ICD-10-CM | POA: Diagnosis not present

## 2018-08-13 DIAGNOSIS — N183 Chronic kidney disease, stage 3 (moderate): Secondary | ICD-10-CM | POA: Diagnosis not present

## 2018-08-14 DIAGNOSIS — R41841 Cognitive communication deficit: Secondary | ICD-10-CM | POA: Diagnosis not present

## 2018-08-14 DIAGNOSIS — I10 Essential (primary) hypertension: Secondary | ICD-10-CM | POA: Diagnosis not present

## 2018-08-14 DIAGNOSIS — R4181 Age-related cognitive decline: Secondary | ICD-10-CM | POA: Diagnosis not present

## 2018-08-14 DIAGNOSIS — F339 Major depressive disorder, recurrent, unspecified: Secondary | ICD-10-CM | POA: Diagnosis not present

## 2018-08-14 DIAGNOSIS — F0390 Unspecified dementia without behavioral disturbance: Secondary | ICD-10-CM | POA: Diagnosis not present

## 2018-08-14 DIAGNOSIS — N183 Chronic kidney disease, stage 3 (moderate): Secondary | ICD-10-CM | POA: Diagnosis not present

## 2018-08-14 DIAGNOSIS — F419 Anxiety disorder, unspecified: Secondary | ICD-10-CM | POA: Diagnosis not present

## 2018-08-15 DIAGNOSIS — F339 Major depressive disorder, recurrent, unspecified: Secondary | ICD-10-CM | POA: Diagnosis not present

## 2018-08-15 DIAGNOSIS — R4181 Age-related cognitive decline: Secondary | ICD-10-CM | POA: Diagnosis not present

## 2018-08-15 DIAGNOSIS — F419 Anxiety disorder, unspecified: Secondary | ICD-10-CM | POA: Diagnosis not present

## 2018-08-15 DIAGNOSIS — F0390 Unspecified dementia without behavioral disturbance: Secondary | ICD-10-CM | POA: Diagnosis not present

## 2018-08-15 DIAGNOSIS — N183 Chronic kidney disease, stage 3 (moderate): Secondary | ICD-10-CM | POA: Diagnosis not present

## 2018-08-15 DIAGNOSIS — I10 Essential (primary) hypertension: Secondary | ICD-10-CM | POA: Diagnosis not present

## 2018-08-15 DIAGNOSIS — R41841 Cognitive communication deficit: Secondary | ICD-10-CM | POA: Diagnosis not present

## 2018-08-20 DIAGNOSIS — N183 Chronic kidney disease, stage 3 (moderate): Secondary | ICD-10-CM | POA: Diagnosis not present

## 2018-08-20 DIAGNOSIS — I10 Essential (primary) hypertension: Secondary | ICD-10-CM | POA: Diagnosis not present

## 2018-08-20 DIAGNOSIS — F339 Major depressive disorder, recurrent, unspecified: Secondary | ICD-10-CM | POA: Diagnosis not present

## 2018-08-20 DIAGNOSIS — F419 Anxiety disorder, unspecified: Secondary | ICD-10-CM | POA: Diagnosis not present

## 2018-08-20 DIAGNOSIS — R4181 Age-related cognitive decline: Secondary | ICD-10-CM | POA: Diagnosis not present

## 2018-08-20 DIAGNOSIS — R41841 Cognitive communication deficit: Secondary | ICD-10-CM | POA: Diagnosis not present

## 2018-08-20 DIAGNOSIS — F0390 Unspecified dementia without behavioral disturbance: Secondary | ICD-10-CM | POA: Diagnosis not present

## 2018-08-22 DIAGNOSIS — L603 Nail dystrophy: Secondary | ICD-10-CM | POA: Diagnosis not present

## 2018-08-22 DIAGNOSIS — B351 Tinea unguium: Secondary | ICD-10-CM | POA: Diagnosis not present

## 2018-08-22 DIAGNOSIS — E119 Type 2 diabetes mellitus without complications: Secondary | ICD-10-CM | POA: Diagnosis not present

## 2018-08-22 DIAGNOSIS — Q845 Enlarged and hypertrophic nails: Secondary | ICD-10-CM | POA: Diagnosis not present

## 2018-08-29 DIAGNOSIS — I10 Essential (primary) hypertension: Secondary | ICD-10-CM | POA: Diagnosis not present

## 2018-08-29 DIAGNOSIS — F0390 Unspecified dementia without behavioral disturbance: Secondary | ICD-10-CM | POA: Diagnosis not present

## 2018-08-29 DIAGNOSIS — F339 Major depressive disorder, recurrent, unspecified: Secondary | ICD-10-CM | POA: Diagnosis not present

## 2018-08-29 DIAGNOSIS — R4181 Age-related cognitive decline: Secondary | ICD-10-CM | POA: Diagnosis not present

## 2018-08-29 DIAGNOSIS — N183 Chronic kidney disease, stage 3 (moderate): Secondary | ICD-10-CM | POA: Diagnosis not present

## 2018-08-29 DIAGNOSIS — R41841 Cognitive communication deficit: Secondary | ICD-10-CM | POA: Diagnosis not present

## 2018-08-29 DIAGNOSIS — F419 Anxiety disorder, unspecified: Secondary | ICD-10-CM | POA: Diagnosis not present

## 2018-09-04 DIAGNOSIS — N183 Chronic kidney disease, stage 3 (moderate): Secondary | ICD-10-CM | POA: Diagnosis not present

## 2018-09-04 DIAGNOSIS — I10 Essential (primary) hypertension: Secondary | ICD-10-CM | POA: Diagnosis not present

## 2018-09-04 DIAGNOSIS — R2689 Other abnormalities of gait and mobility: Secondary | ICD-10-CM | POA: Diagnosis not present

## 2018-09-04 DIAGNOSIS — F0390 Unspecified dementia without behavioral disturbance: Secondary | ICD-10-CM | POA: Diagnosis not present

## 2018-09-19 DIAGNOSIS — I10 Essential (primary) hypertension: Secondary | ICD-10-CM | POA: Diagnosis not present

## 2018-09-19 DIAGNOSIS — R945 Abnormal results of liver function studies: Secondary | ICD-10-CM | POA: Diagnosis not present

## 2018-09-19 DIAGNOSIS — E119 Type 2 diabetes mellitus without complications: Secondary | ICD-10-CM | POA: Diagnosis not present

## 2018-09-19 DIAGNOSIS — R197 Diarrhea, unspecified: Secondary | ICD-10-CM | POA: Diagnosis not present

## 2018-10-07 DIAGNOSIS — I10 Essential (primary) hypertension: Secondary | ICD-10-CM | POA: Diagnosis not present

## 2018-10-07 DIAGNOSIS — F322 Major depressive disorder, single episode, severe without psychotic features: Secondary | ICD-10-CM | POA: Diagnosis not present

## 2018-10-07 DIAGNOSIS — N183 Chronic kidney disease, stage 3 (moderate): Secondary | ICD-10-CM | POA: Diagnosis not present

## 2018-10-07 DIAGNOSIS — F0390 Unspecified dementia without behavioral disturbance: Secondary | ICD-10-CM | POA: Diagnosis not present

## 2018-10-11 DIAGNOSIS — N390 Urinary tract infection, site not specified: Secondary | ICD-10-CM | POA: Diagnosis not present

## 2018-10-14 DIAGNOSIS — F0391 Unspecified dementia with behavioral disturbance: Secondary | ICD-10-CM | POA: Diagnosis not present

## 2018-10-14 DIAGNOSIS — F419 Anxiety disorder, unspecified: Secondary | ICD-10-CM | POA: Diagnosis not present

## 2018-10-14 DIAGNOSIS — R451 Restlessness and agitation: Secondary | ICD-10-CM | POA: Diagnosis not present

## 2018-10-14 DIAGNOSIS — F339 Major depressive disorder, recurrent, unspecified: Secondary | ICD-10-CM | POA: Diagnosis not present

## 2018-10-16 DIAGNOSIS — F419 Anxiety disorder, unspecified: Secondary | ICD-10-CM | POA: Diagnosis not present

## 2018-10-16 DIAGNOSIS — E119 Type 2 diabetes mellitus without complications: Secondary | ICD-10-CM | POA: Diagnosis not present

## 2018-10-16 DIAGNOSIS — R4181 Age-related cognitive decline: Secondary | ICD-10-CM | POA: Diagnosis not present

## 2018-10-16 DIAGNOSIS — E1169 Type 2 diabetes mellitus with other specified complication: Secondary | ICD-10-CM | POA: Diagnosis not present

## 2018-10-16 DIAGNOSIS — N183 Chronic kidney disease, stage 3 (moderate): Secondary | ICD-10-CM | POA: Diagnosis not present

## 2018-10-16 DIAGNOSIS — R2681 Unsteadiness on feet: Secondary | ICD-10-CM | POA: Diagnosis not present

## 2018-10-16 DIAGNOSIS — R41841 Cognitive communication deficit: Secondary | ICD-10-CM | POA: Diagnosis not present

## 2018-10-16 DIAGNOSIS — I10 Essential (primary) hypertension: Secondary | ICD-10-CM | POA: Diagnosis not present

## 2018-10-16 DIAGNOSIS — F339 Major depressive disorder, recurrent, unspecified: Secondary | ICD-10-CM | POA: Diagnosis not present

## 2018-10-16 DIAGNOSIS — F0390 Unspecified dementia without behavioral disturbance: Secondary | ICD-10-CM | POA: Diagnosis not present

## 2018-10-17 DIAGNOSIS — R2681 Unsteadiness on feet: Secondary | ICD-10-CM | POA: Diagnosis not present

## 2018-10-17 DIAGNOSIS — E119 Type 2 diabetes mellitus without complications: Secondary | ICD-10-CM | POA: Diagnosis not present

## 2018-10-17 DIAGNOSIS — R41841 Cognitive communication deficit: Secondary | ICD-10-CM | POA: Diagnosis not present

## 2018-10-17 DIAGNOSIS — F0390 Unspecified dementia without behavioral disturbance: Secondary | ICD-10-CM | POA: Diagnosis not present

## 2018-10-17 DIAGNOSIS — E1169 Type 2 diabetes mellitus with other specified complication: Secondary | ICD-10-CM | POA: Diagnosis not present

## 2018-10-17 DIAGNOSIS — R4181 Age-related cognitive decline: Secondary | ICD-10-CM | POA: Diagnosis not present

## 2018-10-17 DIAGNOSIS — N183 Chronic kidney disease, stage 3 (moderate): Secondary | ICD-10-CM | POA: Diagnosis not present

## 2018-10-17 DIAGNOSIS — F419 Anxiety disorder, unspecified: Secondary | ICD-10-CM | POA: Diagnosis not present

## 2018-10-17 DIAGNOSIS — F339 Major depressive disorder, recurrent, unspecified: Secondary | ICD-10-CM | POA: Diagnosis not present

## 2018-10-17 DIAGNOSIS — I10 Essential (primary) hypertension: Secondary | ICD-10-CM | POA: Diagnosis not present

## 2018-10-19 DIAGNOSIS — E1169 Type 2 diabetes mellitus with other specified complication: Secondary | ICD-10-CM | POA: Diagnosis not present

## 2018-10-19 DIAGNOSIS — R4181 Age-related cognitive decline: Secondary | ICD-10-CM | POA: Diagnosis not present

## 2018-10-19 DIAGNOSIS — F339 Major depressive disorder, recurrent, unspecified: Secondary | ICD-10-CM | POA: Diagnosis not present

## 2018-10-19 DIAGNOSIS — F0390 Unspecified dementia without behavioral disturbance: Secondary | ICD-10-CM | POA: Diagnosis not present

## 2018-10-19 DIAGNOSIS — I10 Essential (primary) hypertension: Secondary | ICD-10-CM | POA: Diagnosis not present

## 2018-10-19 DIAGNOSIS — R2681 Unsteadiness on feet: Secondary | ICD-10-CM | POA: Diagnosis not present

## 2018-10-19 DIAGNOSIS — E119 Type 2 diabetes mellitus without complications: Secondary | ICD-10-CM | POA: Diagnosis not present

## 2018-10-19 DIAGNOSIS — F419 Anxiety disorder, unspecified: Secondary | ICD-10-CM | POA: Diagnosis not present

## 2018-10-19 DIAGNOSIS — N183 Chronic kidney disease, stage 3 (moderate): Secondary | ICD-10-CM | POA: Diagnosis not present

## 2018-10-19 DIAGNOSIS — R41841 Cognitive communication deficit: Secondary | ICD-10-CM | POA: Diagnosis not present

## 2018-11-06 DIAGNOSIS — I10 Essential (primary) hypertension: Secondary | ICD-10-CM | POA: Diagnosis not present

## 2018-11-06 DIAGNOSIS — N183 Chronic kidney disease, stage 3 (moderate): Secondary | ICD-10-CM | POA: Diagnosis not present

## 2018-11-06 DIAGNOSIS — F0390 Unspecified dementia without behavioral disturbance: Secondary | ICD-10-CM | POA: Diagnosis not present

## 2018-11-06 DIAGNOSIS — F322 Major depressive disorder, single episode, severe without psychotic features: Secondary | ICD-10-CM | POA: Diagnosis not present

## 2018-11-22 DIAGNOSIS — M533 Sacrococcygeal disorders, not elsewhere classified: Secondary | ICD-10-CM | POA: Diagnosis not present

## 2018-12-05 DIAGNOSIS — E46 Unspecified protein-calorie malnutrition: Secondary | ICD-10-CM | POA: Diagnosis not present

## 2018-12-05 DIAGNOSIS — E1122 Type 2 diabetes mellitus with diabetic chronic kidney disease: Secondary | ICD-10-CM | POA: Diagnosis not present

## 2018-12-05 DIAGNOSIS — F322 Major depressive disorder, single episode, severe without psychotic features: Secondary | ICD-10-CM | POA: Diagnosis not present

## 2018-12-05 DIAGNOSIS — G309 Alzheimer's disease, unspecified: Secondary | ICD-10-CM | POA: Diagnosis not present

## 2019-01-02 DIAGNOSIS — F322 Major depressive disorder, single episode, severe without psychotic features: Secondary | ICD-10-CM | POA: Diagnosis not present

## 2019-01-02 DIAGNOSIS — E1122 Type 2 diabetes mellitus with diabetic chronic kidney disease: Secondary | ICD-10-CM | POA: Diagnosis not present

## 2019-01-02 DIAGNOSIS — E46 Unspecified protein-calorie malnutrition: Secondary | ICD-10-CM | POA: Diagnosis not present

## 2019-01-02 DIAGNOSIS — G309 Alzheimer's disease, unspecified: Secondary | ICD-10-CM | POA: Diagnosis not present

## 2019-01-13 DIAGNOSIS — E1129 Type 2 diabetes mellitus with other diabetic kidney complication: Secondary | ICD-10-CM | POA: Diagnosis not present

## 2019-01-20 DIAGNOSIS — E119 Type 2 diabetes mellitus without complications: Secondary | ICD-10-CM | POA: Diagnosis not present

## 2019-01-20 DIAGNOSIS — R41841 Cognitive communication deficit: Secondary | ICD-10-CM | POA: Diagnosis not present

## 2019-01-20 DIAGNOSIS — R2681 Unsteadiness on feet: Secondary | ICD-10-CM | POA: Diagnosis not present

## 2019-01-20 DIAGNOSIS — F0390 Unspecified dementia without behavioral disturbance: Secondary | ICD-10-CM | POA: Diagnosis not present

## 2019-01-20 DIAGNOSIS — F339 Major depressive disorder, recurrent, unspecified: Secondary | ICD-10-CM | POA: Diagnosis not present

## 2019-01-20 DIAGNOSIS — R4181 Age-related cognitive decline: Secondary | ICD-10-CM | POA: Diagnosis not present

## 2019-01-20 DIAGNOSIS — I10 Essential (primary) hypertension: Secondary | ICD-10-CM | POA: Diagnosis not present

## 2019-01-20 DIAGNOSIS — N183 Chronic kidney disease, stage 3 (moderate): Secondary | ICD-10-CM | POA: Diagnosis not present

## 2019-01-20 DIAGNOSIS — F419 Anxiety disorder, unspecified: Secondary | ICD-10-CM | POA: Diagnosis not present

## 2019-01-20 DIAGNOSIS — E1169 Type 2 diabetes mellitus with other specified complication: Secondary | ICD-10-CM | POA: Diagnosis not present

## 2019-01-21 DIAGNOSIS — F0390 Unspecified dementia without behavioral disturbance: Secondary | ICD-10-CM | POA: Diagnosis not present

## 2019-01-21 DIAGNOSIS — R2681 Unsteadiness on feet: Secondary | ICD-10-CM | POA: Diagnosis not present

## 2019-01-21 DIAGNOSIS — E1169 Type 2 diabetes mellitus with other specified complication: Secondary | ICD-10-CM | POA: Diagnosis not present

## 2019-01-21 DIAGNOSIS — N183 Chronic kidney disease, stage 3 (moderate): Secondary | ICD-10-CM | POA: Diagnosis not present

## 2019-01-21 DIAGNOSIS — F339 Major depressive disorder, recurrent, unspecified: Secondary | ICD-10-CM | POA: Diagnosis not present

## 2019-01-21 DIAGNOSIS — I10 Essential (primary) hypertension: Secondary | ICD-10-CM | POA: Diagnosis not present

## 2019-01-21 DIAGNOSIS — R4181 Age-related cognitive decline: Secondary | ICD-10-CM | POA: Diagnosis not present

## 2019-01-21 DIAGNOSIS — R41841 Cognitive communication deficit: Secondary | ICD-10-CM | POA: Diagnosis not present

## 2019-01-21 DIAGNOSIS — F419 Anxiety disorder, unspecified: Secondary | ICD-10-CM | POA: Diagnosis not present

## 2019-01-21 DIAGNOSIS — E119 Type 2 diabetes mellitus without complications: Secondary | ICD-10-CM | POA: Diagnosis not present

## 2019-01-22 DIAGNOSIS — I10 Essential (primary) hypertension: Secondary | ICD-10-CM | POA: Diagnosis not present

## 2019-01-22 DIAGNOSIS — N183 Chronic kidney disease, stage 3 (moderate): Secondary | ICD-10-CM | POA: Diagnosis not present

## 2019-01-22 DIAGNOSIS — F419 Anxiety disorder, unspecified: Secondary | ICD-10-CM | POA: Diagnosis not present

## 2019-01-22 DIAGNOSIS — R4181 Age-related cognitive decline: Secondary | ICD-10-CM | POA: Diagnosis not present

## 2019-01-22 DIAGNOSIS — F0391 Unspecified dementia with behavioral disturbance: Secondary | ICD-10-CM | POA: Diagnosis not present

## 2019-01-22 DIAGNOSIS — F0390 Unspecified dementia without behavioral disturbance: Secondary | ICD-10-CM | POA: Diagnosis not present

## 2019-01-22 DIAGNOSIS — E1169 Type 2 diabetes mellitus with other specified complication: Secondary | ICD-10-CM | POA: Diagnosis not present

## 2019-01-22 DIAGNOSIS — R41841 Cognitive communication deficit: Secondary | ICD-10-CM | POA: Diagnosis not present

## 2019-01-22 DIAGNOSIS — R2681 Unsteadiness on feet: Secondary | ICD-10-CM | POA: Diagnosis not present

## 2019-01-22 DIAGNOSIS — E119 Type 2 diabetes mellitus without complications: Secondary | ICD-10-CM | POA: Diagnosis not present

## 2019-01-22 DIAGNOSIS — W19XXXD Unspecified fall, subsequent encounter: Secondary | ICD-10-CM | POA: Diagnosis not present

## 2019-01-22 DIAGNOSIS — F339 Major depressive disorder, recurrent, unspecified: Secondary | ICD-10-CM | POA: Diagnosis not present

## 2019-01-23 DIAGNOSIS — F0390 Unspecified dementia without behavioral disturbance: Secondary | ICD-10-CM | POA: Diagnosis not present

## 2019-01-23 DIAGNOSIS — R41841 Cognitive communication deficit: Secondary | ICD-10-CM | POA: Diagnosis not present

## 2019-01-23 DIAGNOSIS — F339 Major depressive disorder, recurrent, unspecified: Secondary | ICD-10-CM | POA: Diagnosis not present

## 2019-01-23 DIAGNOSIS — I10 Essential (primary) hypertension: Secondary | ICD-10-CM | POA: Diagnosis not present

## 2019-01-23 DIAGNOSIS — F419 Anxiety disorder, unspecified: Secondary | ICD-10-CM | POA: Diagnosis not present

## 2019-01-23 DIAGNOSIS — N183 Chronic kidney disease, stage 3 (moderate): Secondary | ICD-10-CM | POA: Diagnosis not present

## 2019-01-23 DIAGNOSIS — E1169 Type 2 diabetes mellitus with other specified complication: Secondary | ICD-10-CM | POA: Diagnosis not present

## 2019-01-23 DIAGNOSIS — R4181 Age-related cognitive decline: Secondary | ICD-10-CM | POA: Diagnosis not present

## 2019-01-23 DIAGNOSIS — R2681 Unsteadiness on feet: Secondary | ICD-10-CM | POA: Diagnosis not present

## 2019-01-23 DIAGNOSIS — E119 Type 2 diabetes mellitus without complications: Secondary | ICD-10-CM | POA: Diagnosis not present

## 2019-01-26 DIAGNOSIS — N183 Chronic kidney disease, stage 3 (moderate): Secondary | ICD-10-CM | POA: Diagnosis not present

## 2019-01-26 DIAGNOSIS — F419 Anxiety disorder, unspecified: Secondary | ICD-10-CM | POA: Diagnosis not present

## 2019-01-26 DIAGNOSIS — R2681 Unsteadiness on feet: Secondary | ICD-10-CM | POA: Diagnosis not present

## 2019-01-26 DIAGNOSIS — F339 Major depressive disorder, recurrent, unspecified: Secondary | ICD-10-CM | POA: Diagnosis not present

## 2019-01-26 DIAGNOSIS — F0390 Unspecified dementia without behavioral disturbance: Secondary | ICD-10-CM | POA: Diagnosis not present

## 2019-01-26 DIAGNOSIS — E1169 Type 2 diabetes mellitus with other specified complication: Secondary | ICD-10-CM | POA: Diagnosis not present

## 2019-01-26 DIAGNOSIS — E119 Type 2 diabetes mellitus without complications: Secondary | ICD-10-CM | POA: Diagnosis not present

## 2019-01-26 DIAGNOSIS — R4181 Age-related cognitive decline: Secondary | ICD-10-CM | POA: Diagnosis not present

## 2019-01-26 DIAGNOSIS — I10 Essential (primary) hypertension: Secondary | ICD-10-CM | POA: Diagnosis not present

## 2019-01-26 DIAGNOSIS — R41841 Cognitive communication deficit: Secondary | ICD-10-CM | POA: Diagnosis not present

## 2019-01-27 DIAGNOSIS — R41841 Cognitive communication deficit: Secondary | ICD-10-CM | POA: Diagnosis not present

## 2019-01-27 DIAGNOSIS — R4181 Age-related cognitive decline: Secondary | ICD-10-CM | POA: Diagnosis not present

## 2019-01-27 DIAGNOSIS — I10 Essential (primary) hypertension: Secondary | ICD-10-CM | POA: Diagnosis not present

## 2019-01-27 DIAGNOSIS — F0390 Unspecified dementia without behavioral disturbance: Secondary | ICD-10-CM | POA: Diagnosis not present

## 2019-01-27 DIAGNOSIS — R2681 Unsteadiness on feet: Secondary | ICD-10-CM | POA: Diagnosis not present

## 2019-01-27 DIAGNOSIS — F339 Major depressive disorder, recurrent, unspecified: Secondary | ICD-10-CM | POA: Diagnosis not present

## 2019-01-27 DIAGNOSIS — E119 Type 2 diabetes mellitus without complications: Secondary | ICD-10-CM | POA: Diagnosis not present

## 2019-01-27 DIAGNOSIS — E1169 Type 2 diabetes mellitus with other specified complication: Secondary | ICD-10-CM | POA: Diagnosis not present

## 2019-01-27 DIAGNOSIS — N183 Chronic kidney disease, stage 3 (moderate): Secondary | ICD-10-CM | POA: Diagnosis not present

## 2019-01-27 DIAGNOSIS — F419 Anxiety disorder, unspecified: Secondary | ICD-10-CM | POA: Diagnosis not present

## 2019-01-28 DIAGNOSIS — R4181 Age-related cognitive decline: Secondary | ICD-10-CM | POA: Diagnosis not present

## 2019-01-28 DIAGNOSIS — F419 Anxiety disorder, unspecified: Secondary | ICD-10-CM | POA: Diagnosis not present

## 2019-01-28 DIAGNOSIS — F339 Major depressive disorder, recurrent, unspecified: Secondary | ICD-10-CM | POA: Diagnosis not present

## 2019-01-28 DIAGNOSIS — R41841 Cognitive communication deficit: Secondary | ICD-10-CM | POA: Diagnosis not present

## 2019-01-28 DIAGNOSIS — N183 Chronic kidney disease, stage 3 (moderate): Secondary | ICD-10-CM | POA: Diagnosis not present

## 2019-01-28 DIAGNOSIS — E1169 Type 2 diabetes mellitus with other specified complication: Secondary | ICD-10-CM | POA: Diagnosis not present

## 2019-01-28 DIAGNOSIS — F0390 Unspecified dementia without behavioral disturbance: Secondary | ICD-10-CM | POA: Diagnosis not present

## 2019-01-28 DIAGNOSIS — R2681 Unsteadiness on feet: Secondary | ICD-10-CM | POA: Diagnosis not present

## 2019-01-28 DIAGNOSIS — I10 Essential (primary) hypertension: Secondary | ICD-10-CM | POA: Diagnosis not present

## 2019-01-28 DIAGNOSIS — E119 Type 2 diabetes mellitus without complications: Secondary | ICD-10-CM | POA: Diagnosis not present

## 2019-01-29 DIAGNOSIS — E119 Type 2 diabetes mellitus without complications: Secondary | ICD-10-CM | POA: Diagnosis not present

## 2019-01-29 DIAGNOSIS — D649 Anemia, unspecified: Secondary | ICD-10-CM | POA: Diagnosis not present

## 2019-01-29 DIAGNOSIS — F0390 Unspecified dementia without behavioral disturbance: Secondary | ICD-10-CM | POA: Diagnosis not present

## 2019-01-29 DIAGNOSIS — E1169 Type 2 diabetes mellitus with other specified complication: Secondary | ICD-10-CM | POA: Diagnosis not present

## 2019-01-29 DIAGNOSIS — N183 Chronic kidney disease, stage 3 (moderate): Secondary | ICD-10-CM | POA: Diagnosis not present

## 2019-01-29 DIAGNOSIS — R4181 Age-related cognitive decline: Secondary | ICD-10-CM | POA: Diagnosis not present

## 2019-01-29 DIAGNOSIS — F339 Major depressive disorder, recurrent, unspecified: Secondary | ICD-10-CM | POA: Diagnosis not present

## 2019-01-29 DIAGNOSIS — R2681 Unsteadiness on feet: Secondary | ICD-10-CM | POA: Diagnosis not present

## 2019-01-29 DIAGNOSIS — W19XXXD Unspecified fall, subsequent encounter: Secondary | ICD-10-CM | POA: Diagnosis not present

## 2019-01-29 DIAGNOSIS — I10 Essential (primary) hypertension: Secondary | ICD-10-CM | POA: Diagnosis not present

## 2019-01-29 DIAGNOSIS — M6281 Muscle weakness (generalized): Secondary | ICD-10-CM | POA: Diagnosis not present

## 2019-01-29 DIAGNOSIS — F0391 Unspecified dementia with behavioral disturbance: Secondary | ICD-10-CM | POA: Diagnosis not present

## 2019-01-29 DIAGNOSIS — R41841 Cognitive communication deficit: Secondary | ICD-10-CM | POA: Diagnosis not present

## 2019-01-29 DIAGNOSIS — F419 Anxiety disorder, unspecified: Secondary | ICD-10-CM | POA: Diagnosis not present

## 2019-01-30 DIAGNOSIS — R41841 Cognitive communication deficit: Secondary | ICD-10-CM | POA: Diagnosis not present

## 2019-01-30 DIAGNOSIS — I10 Essential (primary) hypertension: Secondary | ICD-10-CM | POA: Diagnosis not present

## 2019-01-30 DIAGNOSIS — E1169 Type 2 diabetes mellitus with other specified complication: Secondary | ICD-10-CM | POA: Diagnosis not present

## 2019-01-30 DIAGNOSIS — R2681 Unsteadiness on feet: Secondary | ICD-10-CM | POA: Diagnosis not present

## 2019-01-30 DIAGNOSIS — E1122 Type 2 diabetes mellitus with diabetic chronic kidney disease: Secondary | ICD-10-CM | POA: Diagnosis not present

## 2019-01-30 DIAGNOSIS — F419 Anxiety disorder, unspecified: Secondary | ICD-10-CM | POA: Diagnosis not present

## 2019-01-30 DIAGNOSIS — R319 Hematuria, unspecified: Secondary | ICD-10-CM | POA: Diagnosis not present

## 2019-01-30 DIAGNOSIS — N183 Chronic kidney disease, stage 3 (moderate): Secondary | ICD-10-CM | POA: Diagnosis not present

## 2019-01-30 DIAGNOSIS — F0390 Unspecified dementia without behavioral disturbance: Secondary | ICD-10-CM | POA: Diagnosis not present

## 2019-01-30 DIAGNOSIS — N390 Urinary tract infection, site not specified: Secondary | ICD-10-CM | POA: Diagnosis not present

## 2019-01-30 DIAGNOSIS — R4181 Age-related cognitive decline: Secondary | ICD-10-CM | POA: Diagnosis not present

## 2019-01-30 DIAGNOSIS — G309 Alzheimer's disease, unspecified: Secondary | ICD-10-CM | POA: Diagnosis not present

## 2019-01-30 DIAGNOSIS — E46 Unspecified protein-calorie malnutrition: Secondary | ICD-10-CM | POA: Diagnosis not present

## 2019-01-30 DIAGNOSIS — E119 Type 2 diabetes mellitus without complications: Secondary | ICD-10-CM | POA: Diagnosis not present

## 2019-01-30 DIAGNOSIS — F339 Major depressive disorder, recurrent, unspecified: Secondary | ICD-10-CM | POA: Diagnosis not present

## 2019-01-30 DIAGNOSIS — F322 Major depressive disorder, single episode, severe without psychotic features: Secondary | ICD-10-CM | POA: Diagnosis not present

## 2019-02-02 DIAGNOSIS — F0390 Unspecified dementia without behavioral disturbance: Secondary | ICD-10-CM | POA: Diagnosis not present

## 2019-02-02 DIAGNOSIS — F419 Anxiety disorder, unspecified: Secondary | ICD-10-CM | POA: Diagnosis not present

## 2019-02-02 DIAGNOSIS — I10 Essential (primary) hypertension: Secondary | ICD-10-CM | POA: Diagnosis not present

## 2019-02-02 DIAGNOSIS — F339 Major depressive disorder, recurrent, unspecified: Secondary | ICD-10-CM | POA: Diagnosis not present

## 2019-02-02 DIAGNOSIS — E1169 Type 2 diabetes mellitus with other specified complication: Secondary | ICD-10-CM | POA: Diagnosis not present

## 2019-02-02 DIAGNOSIS — R41841 Cognitive communication deficit: Secondary | ICD-10-CM | POA: Diagnosis not present

## 2019-02-02 DIAGNOSIS — E119 Type 2 diabetes mellitus without complications: Secondary | ICD-10-CM | POA: Diagnosis not present

## 2019-02-02 DIAGNOSIS — R2681 Unsteadiness on feet: Secondary | ICD-10-CM | POA: Diagnosis not present

## 2019-02-02 DIAGNOSIS — N183 Chronic kidney disease, stage 3 (moderate): Secondary | ICD-10-CM | POA: Diagnosis not present

## 2019-02-02 DIAGNOSIS — R4181 Age-related cognitive decline: Secondary | ICD-10-CM | POA: Diagnosis not present

## 2019-02-03 DIAGNOSIS — F0390 Unspecified dementia without behavioral disturbance: Secondary | ICD-10-CM | POA: Diagnosis not present

## 2019-02-03 DIAGNOSIS — R4181 Age-related cognitive decline: Secondary | ICD-10-CM | POA: Diagnosis not present

## 2019-02-03 DIAGNOSIS — E1169 Type 2 diabetes mellitus with other specified complication: Secondary | ICD-10-CM | POA: Diagnosis not present

## 2019-02-03 DIAGNOSIS — F339 Major depressive disorder, recurrent, unspecified: Secondary | ICD-10-CM | POA: Diagnosis not present

## 2019-02-03 DIAGNOSIS — N183 Chronic kidney disease, stage 3 (moderate): Secondary | ICD-10-CM | POA: Diagnosis not present

## 2019-02-03 DIAGNOSIS — E119 Type 2 diabetes mellitus without complications: Secondary | ICD-10-CM | POA: Diagnosis not present

## 2019-02-03 DIAGNOSIS — R41841 Cognitive communication deficit: Secondary | ICD-10-CM | POA: Diagnosis not present

## 2019-02-03 DIAGNOSIS — F419 Anxiety disorder, unspecified: Secondary | ICD-10-CM | POA: Diagnosis not present

## 2019-02-03 DIAGNOSIS — R2681 Unsteadiness on feet: Secondary | ICD-10-CM | POA: Diagnosis not present

## 2019-02-03 DIAGNOSIS — I10 Essential (primary) hypertension: Secondary | ICD-10-CM | POA: Diagnosis not present

## 2019-02-04 DIAGNOSIS — E1169 Type 2 diabetes mellitus with other specified complication: Secondary | ICD-10-CM | POA: Diagnosis not present

## 2019-02-04 DIAGNOSIS — R2681 Unsteadiness on feet: Secondary | ICD-10-CM | POA: Diagnosis not present

## 2019-02-04 DIAGNOSIS — I10 Essential (primary) hypertension: Secondary | ICD-10-CM | POA: Diagnosis not present

## 2019-02-04 DIAGNOSIS — F339 Major depressive disorder, recurrent, unspecified: Secondary | ICD-10-CM | POA: Diagnosis not present

## 2019-02-04 DIAGNOSIS — F0390 Unspecified dementia without behavioral disturbance: Secondary | ICD-10-CM | POA: Diagnosis not present

## 2019-02-04 DIAGNOSIS — F419 Anxiety disorder, unspecified: Secondary | ICD-10-CM | POA: Diagnosis not present

## 2019-02-04 DIAGNOSIS — N183 Chronic kidney disease, stage 3 (moderate): Secondary | ICD-10-CM | POA: Diagnosis not present

## 2019-02-04 DIAGNOSIS — R41841 Cognitive communication deficit: Secondary | ICD-10-CM | POA: Diagnosis not present

## 2019-02-04 DIAGNOSIS — E119 Type 2 diabetes mellitus without complications: Secondary | ICD-10-CM | POA: Diagnosis not present

## 2019-02-04 DIAGNOSIS — R4181 Age-related cognitive decline: Secondary | ICD-10-CM | POA: Diagnosis not present

## 2019-02-05 DIAGNOSIS — E119 Type 2 diabetes mellitus without complications: Secondary | ICD-10-CM | POA: Diagnosis not present

## 2019-02-05 DIAGNOSIS — W19XXXD Unspecified fall, subsequent encounter: Secondary | ICD-10-CM | POA: Diagnosis not present

## 2019-02-05 DIAGNOSIS — E1169 Type 2 diabetes mellitus with other specified complication: Secondary | ICD-10-CM | POA: Diagnosis not present

## 2019-02-05 DIAGNOSIS — F419 Anxiety disorder, unspecified: Secondary | ICD-10-CM | POA: Diagnosis not present

## 2019-02-05 DIAGNOSIS — F339 Major depressive disorder, recurrent, unspecified: Secondary | ICD-10-CM | POA: Diagnosis not present

## 2019-02-05 DIAGNOSIS — R634 Abnormal weight loss: Secondary | ICD-10-CM | POA: Diagnosis not present

## 2019-02-05 DIAGNOSIS — R2681 Unsteadiness on feet: Secondary | ICD-10-CM | POA: Diagnosis not present

## 2019-02-05 DIAGNOSIS — R41841 Cognitive communication deficit: Secondary | ICD-10-CM | POA: Diagnosis not present

## 2019-02-05 DIAGNOSIS — F0391 Unspecified dementia with behavioral disturbance: Secondary | ICD-10-CM | POA: Diagnosis not present

## 2019-02-05 DIAGNOSIS — N183 Chronic kidney disease, stage 3 (moderate): Secondary | ICD-10-CM | POA: Diagnosis not present

## 2019-02-05 DIAGNOSIS — F0390 Unspecified dementia without behavioral disturbance: Secondary | ICD-10-CM | POA: Diagnosis not present

## 2019-02-05 DIAGNOSIS — R4181 Age-related cognitive decline: Secondary | ICD-10-CM | POA: Diagnosis not present

## 2019-02-05 DIAGNOSIS — I10 Essential (primary) hypertension: Secondary | ICD-10-CM | POA: Diagnosis not present

## 2019-02-06 DIAGNOSIS — R4181 Age-related cognitive decline: Secondary | ICD-10-CM | POA: Diagnosis not present

## 2019-02-06 DIAGNOSIS — E1169 Type 2 diabetes mellitus with other specified complication: Secondary | ICD-10-CM | POA: Diagnosis not present

## 2019-02-06 DIAGNOSIS — E119 Type 2 diabetes mellitus without complications: Secondary | ICD-10-CM | POA: Diagnosis not present

## 2019-02-06 DIAGNOSIS — R2681 Unsteadiness on feet: Secondary | ICD-10-CM | POA: Diagnosis not present

## 2019-02-06 DIAGNOSIS — R41841 Cognitive communication deficit: Secondary | ICD-10-CM | POA: Diagnosis not present

## 2019-02-06 DIAGNOSIS — N183 Chronic kidney disease, stage 3 (moderate): Secondary | ICD-10-CM | POA: Diagnosis not present

## 2019-02-06 DIAGNOSIS — F0390 Unspecified dementia without behavioral disturbance: Secondary | ICD-10-CM | POA: Diagnosis not present

## 2019-02-06 DIAGNOSIS — I10 Essential (primary) hypertension: Secondary | ICD-10-CM | POA: Diagnosis not present

## 2019-02-06 DIAGNOSIS — F339 Major depressive disorder, recurrent, unspecified: Secondary | ICD-10-CM | POA: Diagnosis not present

## 2019-02-06 DIAGNOSIS — F419 Anxiety disorder, unspecified: Secondary | ICD-10-CM | POA: Diagnosis not present

## 2019-02-09 DIAGNOSIS — N183 Chronic kidney disease, stage 3 (moderate): Secondary | ICD-10-CM | POA: Diagnosis not present

## 2019-02-09 DIAGNOSIS — F419 Anxiety disorder, unspecified: Secondary | ICD-10-CM | POA: Diagnosis not present

## 2019-02-09 DIAGNOSIS — E1169 Type 2 diabetes mellitus with other specified complication: Secondary | ICD-10-CM | POA: Diagnosis not present

## 2019-02-09 DIAGNOSIS — F0391 Unspecified dementia with behavioral disturbance: Secondary | ICD-10-CM | POA: Diagnosis not present

## 2019-02-09 DIAGNOSIS — R4181 Age-related cognitive decline: Secondary | ICD-10-CM | POA: Diagnosis not present

## 2019-02-09 DIAGNOSIS — E119 Type 2 diabetes mellitus without complications: Secondary | ICD-10-CM | POA: Diagnosis not present

## 2019-02-09 DIAGNOSIS — R2681 Unsteadiness on feet: Secondary | ICD-10-CM | POA: Diagnosis not present

## 2019-02-09 DIAGNOSIS — F0390 Unspecified dementia without behavioral disturbance: Secondary | ICD-10-CM | POA: Diagnosis not present

## 2019-02-09 DIAGNOSIS — F339 Major depressive disorder, recurrent, unspecified: Secondary | ICD-10-CM | POA: Diagnosis not present

## 2019-02-09 DIAGNOSIS — R634 Abnormal weight loss: Secondary | ICD-10-CM | POA: Diagnosis not present

## 2019-02-09 DIAGNOSIS — I10 Essential (primary) hypertension: Secondary | ICD-10-CM | POA: Diagnosis not present

## 2019-02-09 DIAGNOSIS — R41841 Cognitive communication deficit: Secondary | ICD-10-CM | POA: Diagnosis not present

## 2019-02-09 DIAGNOSIS — W19XXXD Unspecified fall, subsequent encounter: Secondary | ICD-10-CM | POA: Diagnosis not present

## 2019-02-09 DIAGNOSIS — R627 Adult failure to thrive: Secondary | ICD-10-CM | POA: Diagnosis not present

## 2019-02-10 DIAGNOSIS — E119 Type 2 diabetes mellitus without complications: Secondary | ICD-10-CM | POA: Diagnosis not present

## 2019-02-10 DIAGNOSIS — R41841 Cognitive communication deficit: Secondary | ICD-10-CM | POA: Diagnosis not present

## 2019-02-10 DIAGNOSIS — E1169 Type 2 diabetes mellitus with other specified complication: Secondary | ICD-10-CM | POA: Diagnosis not present

## 2019-02-10 DIAGNOSIS — F0390 Unspecified dementia without behavioral disturbance: Secondary | ICD-10-CM | POA: Diagnosis not present

## 2019-02-10 DIAGNOSIS — R4181 Age-related cognitive decline: Secondary | ICD-10-CM | POA: Diagnosis not present

## 2019-02-10 DIAGNOSIS — N183 Chronic kidney disease, stage 3 (moderate): Secondary | ICD-10-CM | POA: Diagnosis not present

## 2019-02-10 DIAGNOSIS — F339 Major depressive disorder, recurrent, unspecified: Secondary | ICD-10-CM | POA: Diagnosis not present

## 2019-02-10 DIAGNOSIS — F419 Anxiety disorder, unspecified: Secondary | ICD-10-CM | POA: Diagnosis not present

## 2019-02-10 DIAGNOSIS — I10 Essential (primary) hypertension: Secondary | ICD-10-CM | POA: Diagnosis not present

## 2019-02-10 DIAGNOSIS — R2681 Unsteadiness on feet: Secondary | ICD-10-CM | POA: Diagnosis not present

## 2019-02-11 DIAGNOSIS — E119 Type 2 diabetes mellitus without complications: Secondary | ICD-10-CM | POA: Diagnosis not present

## 2019-02-11 DIAGNOSIS — F339 Major depressive disorder, recurrent, unspecified: Secondary | ICD-10-CM | POA: Diagnosis not present

## 2019-02-11 DIAGNOSIS — R41841 Cognitive communication deficit: Secondary | ICD-10-CM | POA: Diagnosis not present

## 2019-02-11 DIAGNOSIS — I10 Essential (primary) hypertension: Secondary | ICD-10-CM | POA: Diagnosis not present

## 2019-02-11 DIAGNOSIS — E1169 Type 2 diabetes mellitus with other specified complication: Secondary | ICD-10-CM | POA: Diagnosis not present

## 2019-02-11 DIAGNOSIS — R4181 Age-related cognitive decline: Secondary | ICD-10-CM | POA: Diagnosis not present

## 2019-02-11 DIAGNOSIS — N183 Chronic kidney disease, stage 3 (moderate): Secondary | ICD-10-CM | POA: Diagnosis not present

## 2019-02-11 DIAGNOSIS — F0390 Unspecified dementia without behavioral disturbance: Secondary | ICD-10-CM | POA: Diagnosis not present

## 2019-02-11 DIAGNOSIS — F419 Anxiety disorder, unspecified: Secondary | ICD-10-CM | POA: Diagnosis not present

## 2019-02-11 DIAGNOSIS — R2681 Unsteadiness on feet: Secondary | ICD-10-CM | POA: Diagnosis not present

## 2019-02-12 DIAGNOSIS — I10 Essential (primary) hypertension: Secondary | ICD-10-CM | POA: Diagnosis not present

## 2019-02-12 DIAGNOSIS — E1169 Type 2 diabetes mellitus with other specified complication: Secondary | ICD-10-CM | POA: Diagnosis not present

## 2019-02-12 DIAGNOSIS — R2681 Unsteadiness on feet: Secondary | ICD-10-CM | POA: Diagnosis not present

## 2019-02-12 DIAGNOSIS — R4181 Age-related cognitive decline: Secondary | ICD-10-CM | POA: Diagnosis not present

## 2019-02-12 DIAGNOSIS — R41841 Cognitive communication deficit: Secondary | ICD-10-CM | POA: Diagnosis not present

## 2019-02-12 DIAGNOSIS — E119 Type 2 diabetes mellitus without complications: Secondary | ICD-10-CM | POA: Diagnosis not present

## 2019-02-12 DIAGNOSIS — F339 Major depressive disorder, recurrent, unspecified: Secondary | ICD-10-CM | POA: Diagnosis not present

## 2019-02-12 DIAGNOSIS — F419 Anxiety disorder, unspecified: Secondary | ICD-10-CM | POA: Diagnosis not present

## 2019-02-12 DIAGNOSIS — N183 Chronic kidney disease, stage 3 (moderate): Secondary | ICD-10-CM | POA: Diagnosis not present

## 2019-02-12 DIAGNOSIS — F0390 Unspecified dementia without behavioral disturbance: Secondary | ICD-10-CM | POA: Diagnosis not present

## 2019-02-13 DIAGNOSIS — R41841 Cognitive communication deficit: Secondary | ICD-10-CM | POA: Diagnosis not present

## 2019-02-13 DIAGNOSIS — F339 Major depressive disorder, recurrent, unspecified: Secondary | ICD-10-CM | POA: Diagnosis not present

## 2019-02-13 DIAGNOSIS — W19XXXD Unspecified fall, subsequent encounter: Secondary | ICD-10-CM | POA: Diagnosis not present

## 2019-02-13 DIAGNOSIS — I10 Essential (primary) hypertension: Secondary | ICD-10-CM | POA: Diagnosis not present

## 2019-02-13 DIAGNOSIS — R627 Adult failure to thrive: Secondary | ICD-10-CM | POA: Diagnosis not present

## 2019-02-13 DIAGNOSIS — E119 Type 2 diabetes mellitus without complications: Secondary | ICD-10-CM | POA: Diagnosis not present

## 2019-02-13 DIAGNOSIS — R2681 Unsteadiness on feet: Secondary | ICD-10-CM | POA: Diagnosis not present

## 2019-02-13 DIAGNOSIS — E1169 Type 2 diabetes mellitus with other specified complication: Secondary | ICD-10-CM | POA: Diagnosis not present

## 2019-02-13 DIAGNOSIS — F419 Anxiety disorder, unspecified: Secondary | ICD-10-CM | POA: Diagnosis not present

## 2019-02-13 DIAGNOSIS — F0391 Unspecified dementia with behavioral disturbance: Secondary | ICD-10-CM | POA: Diagnosis not present

## 2019-02-13 DIAGNOSIS — F0390 Unspecified dementia without behavioral disturbance: Secondary | ICD-10-CM | POA: Diagnosis not present

## 2019-02-13 DIAGNOSIS — N183 Chronic kidney disease, stage 3 (moderate): Secondary | ICD-10-CM | POA: Diagnosis not present

## 2019-02-13 DIAGNOSIS — R4181 Age-related cognitive decline: Secondary | ICD-10-CM | POA: Diagnosis not present

## 2019-02-13 DIAGNOSIS — R634 Abnormal weight loss: Secondary | ICD-10-CM | POA: Diagnosis not present

## 2019-02-16 DIAGNOSIS — F0391 Unspecified dementia with behavioral disturbance: Secondary | ICD-10-CM | POA: Diagnosis not present

## 2019-02-16 DIAGNOSIS — R945 Abnormal results of liver function studies: Secondary | ICD-10-CM | POA: Diagnosis not present

## 2019-02-16 DIAGNOSIS — R2681 Unsteadiness on feet: Secondary | ICD-10-CM | POA: Diagnosis not present

## 2019-02-16 DIAGNOSIS — W19XXXD Unspecified fall, subsequent encounter: Secondary | ICD-10-CM | POA: Diagnosis not present

## 2019-02-23 DIAGNOSIS — E559 Vitamin D deficiency, unspecified: Secondary | ICD-10-CM | POA: Diagnosis not present

## 2019-02-23 DIAGNOSIS — E785 Hyperlipidemia, unspecified: Secondary | ICD-10-CM | POA: Diagnosis not present

## 2019-02-23 DIAGNOSIS — E119 Type 2 diabetes mellitus without complications: Secondary | ICD-10-CM | POA: Diagnosis not present

## 2019-02-23 DIAGNOSIS — D649 Anemia, unspecified: Secondary | ICD-10-CM | POA: Diagnosis not present

## 2019-03-06 DIAGNOSIS — E46 Unspecified protein-calorie malnutrition: Secondary | ICD-10-CM | POA: Diagnosis not present

## 2019-03-06 DIAGNOSIS — E1122 Type 2 diabetes mellitus with diabetic chronic kidney disease: Secondary | ICD-10-CM | POA: Diagnosis not present

## 2019-03-06 DIAGNOSIS — F322 Major depressive disorder, single episode, severe without psychotic features: Secondary | ICD-10-CM | POA: Diagnosis not present

## 2019-03-06 DIAGNOSIS — G309 Alzheimer's disease, unspecified: Secondary | ICD-10-CM | POA: Diagnosis not present

## 2019-03-24 DIAGNOSIS — F0391 Unspecified dementia with behavioral disturbance: Secondary | ICD-10-CM | POA: Diagnosis not present

## 2019-03-24 DIAGNOSIS — R634 Abnormal weight loss: Secondary | ICD-10-CM | POA: Diagnosis not present

## 2019-03-24 DIAGNOSIS — R627 Adult failure to thrive: Secondary | ICD-10-CM | POA: Diagnosis not present

## 2019-03-24 DIAGNOSIS — W19XXXD Unspecified fall, subsequent encounter: Secondary | ICD-10-CM | POA: Diagnosis not present

## 2019-04-03 DIAGNOSIS — E1122 Type 2 diabetes mellitus with diabetic chronic kidney disease: Secondary | ICD-10-CM | POA: Diagnosis not present

## 2019-04-03 DIAGNOSIS — F0391 Unspecified dementia with behavioral disturbance: Secondary | ICD-10-CM | POA: Diagnosis not present

## 2019-04-03 DIAGNOSIS — M79602 Pain in left arm: Secondary | ICD-10-CM | POA: Diagnosis not present

## 2019-04-03 DIAGNOSIS — R634 Abnormal weight loss: Secondary | ICD-10-CM | POA: Diagnosis not present

## 2019-04-03 DIAGNOSIS — G309 Alzheimer's disease, unspecified: Secondary | ICD-10-CM | POA: Diagnosis not present

## 2019-04-03 DIAGNOSIS — E119 Type 2 diabetes mellitus without complications: Secondary | ICD-10-CM | POA: Diagnosis not present

## 2019-04-03 DIAGNOSIS — F322 Major depressive disorder, single episode, severe without psychotic features: Secondary | ICD-10-CM | POA: Diagnosis not present

## 2019-04-03 DIAGNOSIS — E46 Unspecified protein-calorie malnutrition: Secondary | ICD-10-CM | POA: Diagnosis not present

## 2019-04-15 DIAGNOSIS — Z20828 Contact with and (suspected) exposure to other viral communicable diseases: Secondary | ICD-10-CM | POA: Diagnosis not present

## 2019-04-22 DIAGNOSIS — F339 Major depressive disorder, recurrent, unspecified: Secondary | ICD-10-CM | POA: Diagnosis not present

## 2019-04-22 DIAGNOSIS — E119 Type 2 diabetes mellitus without complications: Secondary | ICD-10-CM | POA: Diagnosis not present

## 2019-04-22 DIAGNOSIS — F0390 Unspecified dementia without behavioral disturbance: Secondary | ICD-10-CM | POA: Diagnosis not present

## 2019-04-22 DIAGNOSIS — Z20828 Contact with and (suspected) exposure to other viral communicable diseases: Secondary | ICD-10-CM | POA: Diagnosis not present

## 2019-04-22 DIAGNOSIS — I10 Essential (primary) hypertension: Secondary | ICD-10-CM | POA: Diagnosis not present

## 2019-04-22 DIAGNOSIS — E1169 Type 2 diabetes mellitus with other specified complication: Secondary | ICD-10-CM | POA: Diagnosis not present

## 2019-04-22 DIAGNOSIS — F419 Anxiety disorder, unspecified: Secondary | ICD-10-CM | POA: Diagnosis not present

## 2019-04-22 DIAGNOSIS — R1311 Dysphagia, oral phase: Secondary | ICD-10-CM | POA: Diagnosis not present

## 2019-04-22 DIAGNOSIS — N183 Chronic kidney disease, stage 3 (moderate): Secondary | ICD-10-CM | POA: Diagnosis not present

## 2019-04-22 DIAGNOSIS — R2681 Unsteadiness on feet: Secondary | ICD-10-CM | POA: Diagnosis not present

## 2019-04-22 DIAGNOSIS — R278 Other lack of coordination: Secondary | ICD-10-CM | POA: Diagnosis not present

## 2019-04-23 DIAGNOSIS — I10 Essential (primary) hypertension: Secondary | ICD-10-CM | POA: Diagnosis not present

## 2019-04-23 DIAGNOSIS — N183 Chronic kidney disease, stage 3 (moderate): Secondary | ICD-10-CM | POA: Diagnosis not present

## 2019-04-23 DIAGNOSIS — R1311 Dysphagia, oral phase: Secondary | ICD-10-CM | POA: Diagnosis not present

## 2019-04-23 DIAGNOSIS — E1169 Type 2 diabetes mellitus with other specified complication: Secondary | ICD-10-CM | POA: Diagnosis not present

## 2019-04-23 DIAGNOSIS — F339 Major depressive disorder, recurrent, unspecified: Secondary | ICD-10-CM | POA: Diagnosis not present

## 2019-04-23 DIAGNOSIS — R2681 Unsteadiness on feet: Secondary | ICD-10-CM | POA: Diagnosis not present

## 2019-04-23 DIAGNOSIS — F0390 Unspecified dementia without behavioral disturbance: Secondary | ICD-10-CM | POA: Diagnosis not present

## 2019-04-23 DIAGNOSIS — F419 Anxiety disorder, unspecified: Secondary | ICD-10-CM | POA: Diagnosis not present

## 2019-04-23 DIAGNOSIS — R278 Other lack of coordination: Secondary | ICD-10-CM | POA: Diagnosis not present

## 2019-04-23 DIAGNOSIS — E119 Type 2 diabetes mellitus without complications: Secondary | ICD-10-CM | POA: Diagnosis not present

## 2019-04-24 DIAGNOSIS — I10 Essential (primary) hypertension: Secondary | ICD-10-CM | POA: Diagnosis not present

## 2019-04-24 DIAGNOSIS — F419 Anxiety disorder, unspecified: Secondary | ICD-10-CM | POA: Diagnosis not present

## 2019-04-24 DIAGNOSIS — F0390 Unspecified dementia without behavioral disturbance: Secondary | ICD-10-CM | POA: Diagnosis not present

## 2019-04-24 DIAGNOSIS — E119 Type 2 diabetes mellitus without complications: Secondary | ICD-10-CM | POA: Diagnosis not present

## 2019-04-24 DIAGNOSIS — R2681 Unsteadiness on feet: Secondary | ICD-10-CM | POA: Diagnosis not present

## 2019-04-24 DIAGNOSIS — N183 Chronic kidney disease, stage 3 (moderate): Secondary | ICD-10-CM | POA: Diagnosis not present

## 2019-04-24 DIAGNOSIS — E1169 Type 2 diabetes mellitus with other specified complication: Secondary | ICD-10-CM | POA: Diagnosis not present

## 2019-04-24 DIAGNOSIS — R1311 Dysphagia, oral phase: Secondary | ICD-10-CM | POA: Diagnosis not present

## 2019-04-24 DIAGNOSIS — F339 Major depressive disorder, recurrent, unspecified: Secondary | ICD-10-CM | POA: Diagnosis not present

## 2019-04-24 DIAGNOSIS — R278 Other lack of coordination: Secondary | ICD-10-CM | POA: Diagnosis not present

## 2019-04-27 DIAGNOSIS — R1311 Dysphagia, oral phase: Secondary | ICD-10-CM | POA: Diagnosis not present

## 2019-04-27 DIAGNOSIS — F419 Anxiety disorder, unspecified: Secondary | ICD-10-CM | POA: Diagnosis not present

## 2019-04-27 DIAGNOSIS — R278 Other lack of coordination: Secondary | ICD-10-CM | POA: Diagnosis not present

## 2019-04-27 DIAGNOSIS — I10 Essential (primary) hypertension: Secondary | ICD-10-CM | POA: Diagnosis not present

## 2019-04-27 DIAGNOSIS — R2681 Unsteadiness on feet: Secondary | ICD-10-CM | POA: Diagnosis not present

## 2019-04-27 DIAGNOSIS — F0390 Unspecified dementia without behavioral disturbance: Secondary | ICD-10-CM | POA: Diagnosis not present

## 2019-04-27 DIAGNOSIS — E119 Type 2 diabetes mellitus without complications: Secondary | ICD-10-CM | POA: Diagnosis not present

## 2019-04-27 DIAGNOSIS — F339 Major depressive disorder, recurrent, unspecified: Secondary | ICD-10-CM | POA: Diagnosis not present

## 2019-04-27 DIAGNOSIS — E1169 Type 2 diabetes mellitus with other specified complication: Secondary | ICD-10-CM | POA: Diagnosis not present

## 2019-04-27 DIAGNOSIS — N183 Chronic kidney disease, stage 3 (moderate): Secondary | ICD-10-CM | POA: Diagnosis not present

## 2019-04-28 DIAGNOSIS — R2681 Unsteadiness on feet: Secondary | ICD-10-CM | POA: Diagnosis not present

## 2019-04-28 DIAGNOSIS — F0390 Unspecified dementia without behavioral disturbance: Secondary | ICD-10-CM | POA: Diagnosis not present

## 2019-04-28 DIAGNOSIS — I10 Essential (primary) hypertension: Secondary | ICD-10-CM | POA: Diagnosis not present

## 2019-04-28 DIAGNOSIS — F419 Anxiety disorder, unspecified: Secondary | ICD-10-CM | POA: Diagnosis not present

## 2019-04-28 DIAGNOSIS — N183 Chronic kidney disease, stage 3 (moderate): Secondary | ICD-10-CM | POA: Diagnosis not present

## 2019-04-28 DIAGNOSIS — R1311 Dysphagia, oral phase: Secondary | ICD-10-CM | POA: Diagnosis not present

## 2019-04-28 DIAGNOSIS — F339 Major depressive disorder, recurrent, unspecified: Secondary | ICD-10-CM | POA: Diagnosis not present

## 2019-04-28 DIAGNOSIS — E119 Type 2 diabetes mellitus without complications: Secondary | ICD-10-CM | POA: Diagnosis not present

## 2019-04-28 DIAGNOSIS — R278 Other lack of coordination: Secondary | ICD-10-CM | POA: Diagnosis not present

## 2019-04-28 DIAGNOSIS — E1169 Type 2 diabetes mellitus with other specified complication: Secondary | ICD-10-CM | POA: Diagnosis not present

## 2019-04-29 DIAGNOSIS — R2681 Unsteadiness on feet: Secondary | ICD-10-CM | POA: Diagnosis not present

## 2019-04-29 DIAGNOSIS — F0390 Unspecified dementia without behavioral disturbance: Secondary | ICD-10-CM | POA: Diagnosis not present

## 2019-04-29 DIAGNOSIS — F419 Anxiety disorder, unspecified: Secondary | ICD-10-CM | POA: Diagnosis not present

## 2019-04-29 DIAGNOSIS — I10 Essential (primary) hypertension: Secondary | ICD-10-CM | POA: Diagnosis not present

## 2019-04-29 DIAGNOSIS — E119 Type 2 diabetes mellitus without complications: Secondary | ICD-10-CM | POA: Diagnosis not present

## 2019-04-29 DIAGNOSIS — R1311 Dysphagia, oral phase: Secondary | ICD-10-CM | POA: Diagnosis not present

## 2019-04-29 DIAGNOSIS — N183 Chronic kidney disease, stage 3 (moderate): Secondary | ICD-10-CM | POA: Diagnosis not present

## 2019-04-29 DIAGNOSIS — F339 Major depressive disorder, recurrent, unspecified: Secondary | ICD-10-CM | POA: Diagnosis not present

## 2019-04-29 DIAGNOSIS — E1169 Type 2 diabetes mellitus with other specified complication: Secondary | ICD-10-CM | POA: Diagnosis not present

## 2019-04-29 DIAGNOSIS — R278 Other lack of coordination: Secondary | ICD-10-CM | POA: Diagnosis not present

## 2019-04-30 DIAGNOSIS — F339 Major depressive disorder, recurrent, unspecified: Secondary | ICD-10-CM | POA: Diagnosis not present

## 2019-04-30 DIAGNOSIS — R1311 Dysphagia, oral phase: Secondary | ICD-10-CM | POA: Diagnosis not present

## 2019-04-30 DIAGNOSIS — F419 Anxiety disorder, unspecified: Secondary | ICD-10-CM | POA: Diagnosis not present

## 2019-04-30 DIAGNOSIS — R278 Other lack of coordination: Secondary | ICD-10-CM | POA: Diagnosis not present

## 2019-04-30 DIAGNOSIS — E119 Type 2 diabetes mellitus without complications: Secondary | ICD-10-CM | POA: Diagnosis not present

## 2019-04-30 DIAGNOSIS — N183 Chronic kidney disease, stage 3 (moderate): Secondary | ICD-10-CM | POA: Diagnosis not present

## 2019-04-30 DIAGNOSIS — R2681 Unsteadiness on feet: Secondary | ICD-10-CM | POA: Diagnosis not present

## 2019-04-30 DIAGNOSIS — I10 Essential (primary) hypertension: Secondary | ICD-10-CM | POA: Diagnosis not present

## 2019-04-30 DIAGNOSIS — F0390 Unspecified dementia without behavioral disturbance: Secondary | ICD-10-CM | POA: Diagnosis not present

## 2019-04-30 DIAGNOSIS — E1169 Type 2 diabetes mellitus with other specified complication: Secondary | ICD-10-CM | POA: Diagnosis not present

## 2019-05-01 DIAGNOSIS — E46 Unspecified protein-calorie malnutrition: Secondary | ICD-10-CM | POA: Diagnosis not present

## 2019-05-01 DIAGNOSIS — R1311 Dysphagia, oral phase: Secondary | ICD-10-CM | POA: Diagnosis not present

## 2019-05-01 DIAGNOSIS — F419 Anxiety disorder, unspecified: Secondary | ICD-10-CM | POA: Diagnosis not present

## 2019-05-01 DIAGNOSIS — E1169 Type 2 diabetes mellitus with other specified complication: Secondary | ICD-10-CM | POA: Diagnosis not present

## 2019-05-01 DIAGNOSIS — R2681 Unsteadiness on feet: Secondary | ICD-10-CM | POA: Diagnosis not present

## 2019-05-01 DIAGNOSIS — I10 Essential (primary) hypertension: Secondary | ICD-10-CM | POA: Diagnosis not present

## 2019-05-01 DIAGNOSIS — F339 Major depressive disorder, recurrent, unspecified: Secondary | ICD-10-CM | POA: Diagnosis not present

## 2019-05-01 DIAGNOSIS — E1122 Type 2 diabetes mellitus with diabetic chronic kidney disease: Secondary | ICD-10-CM | POA: Diagnosis not present

## 2019-05-01 DIAGNOSIS — E119 Type 2 diabetes mellitus without complications: Secondary | ICD-10-CM | POA: Diagnosis not present

## 2019-05-01 DIAGNOSIS — F322 Major depressive disorder, single episode, severe without psychotic features: Secondary | ICD-10-CM | POA: Diagnosis not present

## 2019-05-01 DIAGNOSIS — N183 Chronic kidney disease, stage 3 (moderate): Secondary | ICD-10-CM | POA: Diagnosis not present

## 2019-05-01 DIAGNOSIS — G309 Alzheimer's disease, unspecified: Secondary | ICD-10-CM | POA: Diagnosis not present

## 2019-05-01 DIAGNOSIS — F0390 Unspecified dementia without behavioral disturbance: Secondary | ICD-10-CM | POA: Diagnosis not present

## 2019-05-01 DIAGNOSIS — R278 Other lack of coordination: Secondary | ICD-10-CM | POA: Diagnosis not present

## 2019-05-04 DIAGNOSIS — R278 Other lack of coordination: Secondary | ICD-10-CM | POA: Diagnosis not present

## 2019-05-04 DIAGNOSIS — F339 Major depressive disorder, recurrent, unspecified: Secondary | ICD-10-CM | POA: Diagnosis not present

## 2019-05-04 DIAGNOSIS — F0390 Unspecified dementia without behavioral disturbance: Secondary | ICD-10-CM | POA: Diagnosis not present

## 2019-05-04 DIAGNOSIS — E1169 Type 2 diabetes mellitus with other specified complication: Secondary | ICD-10-CM | POA: Diagnosis not present

## 2019-05-04 DIAGNOSIS — R2681 Unsteadiness on feet: Secondary | ICD-10-CM | POA: Diagnosis not present

## 2019-05-04 DIAGNOSIS — I10 Essential (primary) hypertension: Secondary | ICD-10-CM | POA: Diagnosis not present

## 2019-05-04 DIAGNOSIS — F419 Anxiety disorder, unspecified: Secondary | ICD-10-CM | POA: Diagnosis not present

## 2019-05-04 DIAGNOSIS — R1311 Dysphagia, oral phase: Secondary | ICD-10-CM | POA: Diagnosis not present

## 2019-05-04 DIAGNOSIS — N183 Chronic kidney disease, stage 3 (moderate): Secondary | ICD-10-CM | POA: Diagnosis not present

## 2019-05-04 DIAGNOSIS — E119 Type 2 diabetes mellitus without complications: Secondary | ICD-10-CM | POA: Diagnosis not present

## 2019-05-05 DIAGNOSIS — F0390 Unspecified dementia without behavioral disturbance: Secondary | ICD-10-CM | POA: Diagnosis not present

## 2019-05-05 DIAGNOSIS — E119 Type 2 diabetes mellitus without complications: Secondary | ICD-10-CM | POA: Diagnosis not present

## 2019-05-05 DIAGNOSIS — R278 Other lack of coordination: Secondary | ICD-10-CM | POA: Diagnosis not present

## 2019-05-05 DIAGNOSIS — N183 Chronic kidney disease, stage 3 (moderate): Secondary | ICD-10-CM | POA: Diagnosis not present

## 2019-05-05 DIAGNOSIS — I10 Essential (primary) hypertension: Secondary | ICD-10-CM | POA: Diagnosis not present

## 2019-05-05 DIAGNOSIS — E1169 Type 2 diabetes mellitus with other specified complication: Secondary | ICD-10-CM | POA: Diagnosis not present

## 2019-05-05 DIAGNOSIS — R1311 Dysphagia, oral phase: Secondary | ICD-10-CM | POA: Diagnosis not present

## 2019-05-05 DIAGNOSIS — F339 Major depressive disorder, recurrent, unspecified: Secondary | ICD-10-CM | POA: Diagnosis not present

## 2019-05-05 DIAGNOSIS — R2681 Unsteadiness on feet: Secondary | ICD-10-CM | POA: Diagnosis not present

## 2019-05-05 DIAGNOSIS — F419 Anxiety disorder, unspecified: Secondary | ICD-10-CM | POA: Diagnosis not present

## 2019-05-06 DIAGNOSIS — R278 Other lack of coordination: Secondary | ICD-10-CM | POA: Diagnosis not present

## 2019-05-06 DIAGNOSIS — E1169 Type 2 diabetes mellitus with other specified complication: Secondary | ICD-10-CM | POA: Diagnosis not present

## 2019-05-06 DIAGNOSIS — F339 Major depressive disorder, recurrent, unspecified: Secondary | ICD-10-CM | POA: Diagnosis not present

## 2019-05-06 DIAGNOSIS — N183 Chronic kidney disease, stage 3 (moderate): Secondary | ICD-10-CM | POA: Diagnosis not present

## 2019-05-06 DIAGNOSIS — I10 Essential (primary) hypertension: Secondary | ICD-10-CM | POA: Diagnosis not present

## 2019-05-06 DIAGNOSIS — R2681 Unsteadiness on feet: Secondary | ICD-10-CM | POA: Diagnosis not present

## 2019-05-06 DIAGNOSIS — F419 Anxiety disorder, unspecified: Secondary | ICD-10-CM | POA: Diagnosis not present

## 2019-05-06 DIAGNOSIS — F0390 Unspecified dementia without behavioral disturbance: Secondary | ICD-10-CM | POA: Diagnosis not present

## 2019-05-06 DIAGNOSIS — E119 Type 2 diabetes mellitus without complications: Secondary | ICD-10-CM | POA: Diagnosis not present

## 2019-05-06 DIAGNOSIS — R1311 Dysphagia, oral phase: Secondary | ICD-10-CM | POA: Diagnosis not present

## 2019-05-07 DIAGNOSIS — N183 Chronic kidney disease, stage 3 (moderate): Secondary | ICD-10-CM | POA: Diagnosis not present

## 2019-05-07 DIAGNOSIS — F419 Anxiety disorder, unspecified: Secondary | ICD-10-CM | POA: Diagnosis not present

## 2019-05-07 DIAGNOSIS — E1169 Type 2 diabetes mellitus with other specified complication: Secondary | ICD-10-CM | POA: Diagnosis not present

## 2019-05-07 DIAGNOSIS — E119 Type 2 diabetes mellitus without complications: Secondary | ICD-10-CM | POA: Diagnosis not present

## 2019-05-07 DIAGNOSIS — I10 Essential (primary) hypertension: Secondary | ICD-10-CM | POA: Diagnosis not present

## 2019-05-07 DIAGNOSIS — F0390 Unspecified dementia without behavioral disturbance: Secondary | ICD-10-CM | POA: Diagnosis not present

## 2019-05-07 DIAGNOSIS — R2681 Unsteadiness on feet: Secondary | ICD-10-CM | POA: Diagnosis not present

## 2019-05-07 DIAGNOSIS — R1311 Dysphagia, oral phase: Secondary | ICD-10-CM | POA: Diagnosis not present

## 2019-05-07 DIAGNOSIS — F339 Major depressive disorder, recurrent, unspecified: Secondary | ICD-10-CM | POA: Diagnosis not present

## 2019-05-07 DIAGNOSIS — R278 Other lack of coordination: Secondary | ICD-10-CM | POA: Diagnosis not present

## 2019-05-08 DIAGNOSIS — R2681 Unsteadiness on feet: Secondary | ICD-10-CM | POA: Diagnosis not present

## 2019-05-08 DIAGNOSIS — I10 Essential (primary) hypertension: Secondary | ICD-10-CM | POA: Diagnosis not present

## 2019-05-08 DIAGNOSIS — F419 Anxiety disorder, unspecified: Secondary | ICD-10-CM | POA: Diagnosis not present

## 2019-05-08 DIAGNOSIS — E119 Type 2 diabetes mellitus without complications: Secondary | ICD-10-CM | POA: Diagnosis not present

## 2019-05-08 DIAGNOSIS — N183 Chronic kidney disease, stage 3 (moderate): Secondary | ICD-10-CM | POA: Diagnosis not present

## 2019-05-08 DIAGNOSIS — R1311 Dysphagia, oral phase: Secondary | ICD-10-CM | POA: Diagnosis not present

## 2019-05-08 DIAGNOSIS — F0390 Unspecified dementia without behavioral disturbance: Secondary | ICD-10-CM | POA: Diagnosis not present

## 2019-05-08 DIAGNOSIS — R278 Other lack of coordination: Secondary | ICD-10-CM | POA: Diagnosis not present

## 2019-05-08 DIAGNOSIS — F339 Major depressive disorder, recurrent, unspecified: Secondary | ICD-10-CM | POA: Diagnosis not present

## 2019-05-08 DIAGNOSIS — E1169 Type 2 diabetes mellitus with other specified complication: Secondary | ICD-10-CM | POA: Diagnosis not present

## 2019-05-11 DIAGNOSIS — N183 Chronic kidney disease, stage 3 (moderate): Secondary | ICD-10-CM | POA: Diagnosis not present

## 2019-05-11 DIAGNOSIS — R1311 Dysphagia, oral phase: Secondary | ICD-10-CM | POA: Diagnosis not present

## 2019-05-11 DIAGNOSIS — E1169 Type 2 diabetes mellitus with other specified complication: Secondary | ICD-10-CM | POA: Diagnosis not present

## 2019-05-11 DIAGNOSIS — F419 Anxiety disorder, unspecified: Secondary | ICD-10-CM | POA: Diagnosis not present

## 2019-05-11 DIAGNOSIS — F0390 Unspecified dementia without behavioral disturbance: Secondary | ICD-10-CM | POA: Diagnosis not present

## 2019-05-11 DIAGNOSIS — I10 Essential (primary) hypertension: Secondary | ICD-10-CM | POA: Diagnosis not present

## 2019-05-11 DIAGNOSIS — R2681 Unsteadiness on feet: Secondary | ICD-10-CM | POA: Diagnosis not present

## 2019-05-11 DIAGNOSIS — E119 Type 2 diabetes mellitus without complications: Secondary | ICD-10-CM | POA: Diagnosis not present

## 2019-05-11 DIAGNOSIS — R278 Other lack of coordination: Secondary | ICD-10-CM | POA: Diagnosis not present

## 2019-05-11 DIAGNOSIS — F339 Major depressive disorder, recurrent, unspecified: Secondary | ICD-10-CM | POA: Diagnosis not present

## 2019-05-12 DIAGNOSIS — N183 Chronic kidney disease, stage 3 (moderate): Secondary | ICD-10-CM | POA: Diagnosis not present

## 2019-05-12 DIAGNOSIS — R278 Other lack of coordination: Secondary | ICD-10-CM | POA: Diagnosis not present

## 2019-05-12 DIAGNOSIS — F0390 Unspecified dementia without behavioral disturbance: Secondary | ICD-10-CM | POA: Diagnosis not present

## 2019-05-12 DIAGNOSIS — F339 Major depressive disorder, recurrent, unspecified: Secondary | ICD-10-CM | POA: Diagnosis not present

## 2019-05-12 DIAGNOSIS — F419 Anxiety disorder, unspecified: Secondary | ICD-10-CM | POA: Diagnosis not present

## 2019-05-12 DIAGNOSIS — R1311 Dysphagia, oral phase: Secondary | ICD-10-CM | POA: Diagnosis not present

## 2019-05-12 DIAGNOSIS — R2681 Unsteadiness on feet: Secondary | ICD-10-CM | POA: Diagnosis not present

## 2019-05-12 DIAGNOSIS — E119 Type 2 diabetes mellitus without complications: Secondary | ICD-10-CM | POA: Diagnosis not present

## 2019-05-12 DIAGNOSIS — E1169 Type 2 diabetes mellitus with other specified complication: Secondary | ICD-10-CM | POA: Diagnosis not present

## 2019-05-12 DIAGNOSIS — I10 Essential (primary) hypertension: Secondary | ICD-10-CM | POA: Diagnosis not present

## 2019-05-13 DIAGNOSIS — E1169 Type 2 diabetes mellitus with other specified complication: Secondary | ICD-10-CM | POA: Diagnosis not present

## 2019-05-13 DIAGNOSIS — F339 Major depressive disorder, recurrent, unspecified: Secondary | ICD-10-CM | POA: Diagnosis not present

## 2019-05-13 DIAGNOSIS — R627 Adult failure to thrive: Secondary | ICD-10-CM | POA: Diagnosis not present

## 2019-05-13 DIAGNOSIS — R2681 Unsteadiness on feet: Secondary | ICD-10-CM | POA: Diagnosis not present

## 2019-05-13 DIAGNOSIS — E119 Type 2 diabetes mellitus without complications: Secondary | ICD-10-CM | POA: Diagnosis not present

## 2019-05-13 DIAGNOSIS — N183 Chronic kidney disease, stage 3 (moderate): Secondary | ICD-10-CM | POA: Diagnosis not present

## 2019-05-13 DIAGNOSIS — R278 Other lack of coordination: Secondary | ICD-10-CM | POA: Diagnosis not present

## 2019-05-13 DIAGNOSIS — F0391 Unspecified dementia with behavioral disturbance: Secondary | ICD-10-CM | POA: Diagnosis not present

## 2019-05-13 DIAGNOSIS — F0390 Unspecified dementia without behavioral disturbance: Secondary | ICD-10-CM | POA: Diagnosis not present

## 2019-05-13 DIAGNOSIS — F419 Anxiety disorder, unspecified: Secondary | ICD-10-CM | POA: Diagnosis not present

## 2019-05-13 DIAGNOSIS — I10 Essential (primary) hypertension: Secondary | ICD-10-CM | POA: Diagnosis not present

## 2019-05-13 DIAGNOSIS — R634 Abnormal weight loss: Secondary | ICD-10-CM | POA: Diagnosis not present

## 2019-05-13 DIAGNOSIS — R1311 Dysphagia, oral phase: Secondary | ICD-10-CM | POA: Diagnosis not present

## 2019-05-14 DIAGNOSIS — I10 Essential (primary) hypertension: Secondary | ICD-10-CM | POA: Diagnosis not present

## 2019-05-14 DIAGNOSIS — R1311 Dysphagia, oral phase: Secondary | ICD-10-CM | POA: Diagnosis not present

## 2019-05-14 DIAGNOSIS — R278 Other lack of coordination: Secondary | ICD-10-CM | POA: Diagnosis not present

## 2019-05-14 DIAGNOSIS — F419 Anxiety disorder, unspecified: Secondary | ICD-10-CM | POA: Diagnosis not present

## 2019-05-14 DIAGNOSIS — F0390 Unspecified dementia without behavioral disturbance: Secondary | ICD-10-CM | POA: Diagnosis not present

## 2019-05-14 DIAGNOSIS — E1169 Type 2 diabetes mellitus with other specified complication: Secondary | ICD-10-CM | POA: Diagnosis not present

## 2019-05-14 DIAGNOSIS — R2681 Unsteadiness on feet: Secondary | ICD-10-CM | POA: Diagnosis not present

## 2019-05-14 DIAGNOSIS — N183 Chronic kidney disease, stage 3 (moderate): Secondary | ICD-10-CM | POA: Diagnosis not present

## 2019-05-14 DIAGNOSIS — E119 Type 2 diabetes mellitus without complications: Secondary | ICD-10-CM | POA: Diagnosis not present

## 2019-05-14 DIAGNOSIS — F339 Major depressive disorder, recurrent, unspecified: Secondary | ICD-10-CM | POA: Diagnosis not present

## 2019-05-15 DIAGNOSIS — F0390 Unspecified dementia without behavioral disturbance: Secondary | ICD-10-CM | POA: Diagnosis not present

## 2019-05-15 DIAGNOSIS — R1311 Dysphagia, oral phase: Secondary | ICD-10-CM | POA: Diagnosis not present

## 2019-05-15 DIAGNOSIS — N183 Chronic kidney disease, stage 3 (moderate): Secondary | ICD-10-CM | POA: Diagnosis not present

## 2019-05-15 DIAGNOSIS — E1169 Type 2 diabetes mellitus with other specified complication: Secondary | ICD-10-CM | POA: Diagnosis not present

## 2019-05-15 DIAGNOSIS — R278 Other lack of coordination: Secondary | ICD-10-CM | POA: Diagnosis not present

## 2019-05-15 DIAGNOSIS — I10 Essential (primary) hypertension: Secondary | ICD-10-CM | POA: Diagnosis not present

## 2019-05-15 DIAGNOSIS — R2681 Unsteadiness on feet: Secondary | ICD-10-CM | POA: Diagnosis not present

## 2019-05-15 DIAGNOSIS — F419 Anxiety disorder, unspecified: Secondary | ICD-10-CM | POA: Diagnosis not present

## 2019-05-15 DIAGNOSIS — E119 Type 2 diabetes mellitus without complications: Secondary | ICD-10-CM | POA: Diagnosis not present

## 2019-05-15 DIAGNOSIS — F339 Major depressive disorder, recurrent, unspecified: Secondary | ICD-10-CM | POA: Diagnosis not present

## 2019-05-18 DIAGNOSIS — F419 Anxiety disorder, unspecified: Secondary | ICD-10-CM | POA: Diagnosis not present

## 2019-05-18 DIAGNOSIS — R1311 Dysphagia, oral phase: Secondary | ICD-10-CM | POA: Diagnosis not present

## 2019-05-18 DIAGNOSIS — R2681 Unsteadiness on feet: Secondary | ICD-10-CM | POA: Diagnosis not present

## 2019-05-18 DIAGNOSIS — F339 Major depressive disorder, recurrent, unspecified: Secondary | ICD-10-CM | POA: Diagnosis not present

## 2019-05-18 DIAGNOSIS — F0390 Unspecified dementia without behavioral disturbance: Secondary | ICD-10-CM | POA: Diagnosis not present

## 2019-05-18 DIAGNOSIS — E119 Type 2 diabetes mellitus without complications: Secondary | ICD-10-CM | POA: Diagnosis not present

## 2019-05-18 DIAGNOSIS — N183 Chronic kidney disease, stage 3 (moderate): Secondary | ICD-10-CM | POA: Diagnosis not present

## 2019-05-18 DIAGNOSIS — R278 Other lack of coordination: Secondary | ICD-10-CM | POA: Diagnosis not present

## 2019-05-18 DIAGNOSIS — E1169 Type 2 diabetes mellitus with other specified complication: Secondary | ICD-10-CM | POA: Diagnosis not present

## 2019-05-18 DIAGNOSIS — I10 Essential (primary) hypertension: Secondary | ICD-10-CM | POA: Diagnosis not present

## 2019-05-19 DIAGNOSIS — N183 Chronic kidney disease, stage 3 (moderate): Secondary | ICD-10-CM | POA: Diagnosis not present

## 2019-05-19 DIAGNOSIS — R2681 Unsteadiness on feet: Secondary | ICD-10-CM | POA: Diagnosis not present

## 2019-05-19 DIAGNOSIS — F339 Major depressive disorder, recurrent, unspecified: Secondary | ICD-10-CM | POA: Diagnosis not present

## 2019-05-19 DIAGNOSIS — E119 Type 2 diabetes mellitus without complications: Secondary | ICD-10-CM | POA: Diagnosis not present

## 2019-05-19 DIAGNOSIS — I10 Essential (primary) hypertension: Secondary | ICD-10-CM | POA: Diagnosis not present

## 2019-05-19 DIAGNOSIS — E1169 Type 2 diabetes mellitus with other specified complication: Secondary | ICD-10-CM | POA: Diagnosis not present

## 2019-05-19 DIAGNOSIS — R1311 Dysphagia, oral phase: Secondary | ICD-10-CM | POA: Diagnosis not present

## 2019-05-19 DIAGNOSIS — R278 Other lack of coordination: Secondary | ICD-10-CM | POA: Diagnosis not present

## 2019-05-19 DIAGNOSIS — F0390 Unspecified dementia without behavioral disturbance: Secondary | ICD-10-CM | POA: Diagnosis not present

## 2019-05-19 DIAGNOSIS — F419 Anxiety disorder, unspecified: Secondary | ICD-10-CM | POA: Diagnosis not present

## 2019-05-20 DIAGNOSIS — E1169 Type 2 diabetes mellitus with other specified complication: Secondary | ICD-10-CM | POA: Diagnosis not present

## 2019-05-20 DIAGNOSIS — R2681 Unsteadiness on feet: Secondary | ICD-10-CM | POA: Diagnosis not present

## 2019-05-20 DIAGNOSIS — F0390 Unspecified dementia without behavioral disturbance: Secondary | ICD-10-CM | POA: Diagnosis not present

## 2019-05-20 DIAGNOSIS — R278 Other lack of coordination: Secondary | ICD-10-CM | POA: Diagnosis not present

## 2019-05-20 DIAGNOSIS — I10 Essential (primary) hypertension: Secondary | ICD-10-CM | POA: Diagnosis not present

## 2019-05-20 DIAGNOSIS — E119 Type 2 diabetes mellitus without complications: Secondary | ICD-10-CM | POA: Diagnosis not present

## 2019-05-20 DIAGNOSIS — F419 Anxiety disorder, unspecified: Secondary | ICD-10-CM | POA: Diagnosis not present

## 2019-05-20 DIAGNOSIS — F339 Major depressive disorder, recurrent, unspecified: Secondary | ICD-10-CM | POA: Diagnosis not present

## 2019-05-20 DIAGNOSIS — N183 Chronic kidney disease, stage 3 (moderate): Secondary | ICD-10-CM | POA: Diagnosis not present

## 2019-05-20 DIAGNOSIS — R1311 Dysphagia, oral phase: Secondary | ICD-10-CM | POA: Diagnosis not present

## 2019-05-21 DIAGNOSIS — E1169 Type 2 diabetes mellitus with other specified complication: Secondary | ICD-10-CM | POA: Diagnosis not present

## 2019-05-21 DIAGNOSIS — R634 Abnormal weight loss: Secondary | ICD-10-CM | POA: Diagnosis not present

## 2019-05-21 DIAGNOSIS — R627 Adult failure to thrive: Secondary | ICD-10-CM | POA: Diagnosis not present

## 2019-05-21 DIAGNOSIS — E119 Type 2 diabetes mellitus without complications: Secondary | ICD-10-CM | POA: Diagnosis not present

## 2019-05-21 DIAGNOSIS — I10 Essential (primary) hypertension: Secondary | ICD-10-CM | POA: Diagnosis not present

## 2019-05-21 DIAGNOSIS — R400 Somnolence: Secondary | ICD-10-CM | POA: Diagnosis not present

## 2019-05-21 DIAGNOSIS — R278 Other lack of coordination: Secondary | ICD-10-CM | POA: Diagnosis not present

## 2019-05-21 DIAGNOSIS — F0390 Unspecified dementia without behavioral disturbance: Secondary | ICD-10-CM | POA: Diagnosis not present

## 2019-05-21 DIAGNOSIS — F419 Anxiety disorder, unspecified: Secondary | ICD-10-CM | POA: Diagnosis not present

## 2019-05-21 DIAGNOSIS — F339 Major depressive disorder, recurrent, unspecified: Secondary | ICD-10-CM | POA: Diagnosis not present

## 2019-05-21 DIAGNOSIS — N183 Chronic kidney disease, stage 3 (moderate): Secondary | ICD-10-CM | POA: Diagnosis not present

## 2019-05-21 DIAGNOSIS — R2681 Unsteadiness on feet: Secondary | ICD-10-CM | POA: Diagnosis not present

## 2019-05-21 DIAGNOSIS — F0391 Unspecified dementia with behavioral disturbance: Secondary | ICD-10-CM | POA: Diagnosis not present

## 2019-05-21 DIAGNOSIS — R1311 Dysphagia, oral phase: Secondary | ICD-10-CM | POA: Diagnosis not present

## 2019-05-22 DIAGNOSIS — F339 Major depressive disorder, recurrent, unspecified: Secondary | ICD-10-CM | POA: Diagnosis not present

## 2019-05-22 DIAGNOSIS — F419 Anxiety disorder, unspecified: Secondary | ICD-10-CM | POA: Diagnosis not present

## 2019-05-22 DIAGNOSIS — F0390 Unspecified dementia without behavioral disturbance: Secondary | ICD-10-CM | POA: Diagnosis not present

## 2019-05-22 DIAGNOSIS — R1311 Dysphagia, oral phase: Secondary | ICD-10-CM | POA: Diagnosis not present

## 2019-05-22 DIAGNOSIS — E1169 Type 2 diabetes mellitus with other specified complication: Secondary | ICD-10-CM | POA: Diagnosis not present

## 2019-05-22 DIAGNOSIS — N183 Chronic kidney disease, stage 3 (moderate): Secondary | ICD-10-CM | POA: Diagnosis not present

## 2019-05-22 DIAGNOSIS — I10 Essential (primary) hypertension: Secondary | ICD-10-CM | POA: Diagnosis not present

## 2019-05-22 DIAGNOSIS — R278 Other lack of coordination: Secondary | ICD-10-CM | POA: Diagnosis not present

## 2019-05-22 DIAGNOSIS — E119 Type 2 diabetes mellitus without complications: Secondary | ICD-10-CM | POA: Diagnosis not present

## 2019-05-22 DIAGNOSIS — R2681 Unsteadiness on feet: Secondary | ICD-10-CM | POA: Diagnosis not present

## 2019-05-25 DIAGNOSIS — I10 Essential (primary) hypertension: Secondary | ICD-10-CM | POA: Diagnosis not present

## 2019-05-25 DIAGNOSIS — R400 Somnolence: Secondary | ICD-10-CM | POA: Diagnosis not present

## 2019-05-25 DIAGNOSIS — F0390 Unspecified dementia without behavioral disturbance: Secondary | ICD-10-CM | POA: Diagnosis not present

## 2019-05-25 DIAGNOSIS — W19XXXD Unspecified fall, subsequent encounter: Secondary | ICD-10-CM | POA: Diagnosis not present

## 2019-05-25 DIAGNOSIS — R634 Abnormal weight loss: Secondary | ICD-10-CM | POA: Diagnosis not present

## 2019-05-25 DIAGNOSIS — E119 Type 2 diabetes mellitus without complications: Secondary | ICD-10-CM | POA: Diagnosis not present

## 2019-05-25 DIAGNOSIS — R2681 Unsteadiness on feet: Secondary | ICD-10-CM | POA: Diagnosis not present

## 2019-05-25 DIAGNOSIS — N183 Chronic kidney disease, stage 3 (moderate): Secondary | ICD-10-CM | POA: Diagnosis not present

## 2019-05-25 DIAGNOSIS — F339 Major depressive disorder, recurrent, unspecified: Secondary | ICD-10-CM | POA: Diagnosis not present

## 2019-05-25 DIAGNOSIS — R278 Other lack of coordination: Secondary | ICD-10-CM | POA: Diagnosis not present

## 2019-05-25 DIAGNOSIS — E1169 Type 2 diabetes mellitus with other specified complication: Secondary | ICD-10-CM | POA: Diagnosis not present

## 2019-05-25 DIAGNOSIS — F419 Anxiety disorder, unspecified: Secondary | ICD-10-CM | POA: Diagnosis not present

## 2019-05-25 DIAGNOSIS — R1311 Dysphagia, oral phase: Secondary | ICD-10-CM | POA: Diagnosis not present

## 2019-05-25 DIAGNOSIS — R627 Adult failure to thrive: Secondary | ICD-10-CM | POA: Diagnosis not present

## 2019-05-26 ENCOUNTER — Encounter (HOSPITAL_COMMUNITY): Payer: Self-pay

## 2019-05-26 ENCOUNTER — Emergency Department (HOSPITAL_COMMUNITY): Payer: Medicare Other

## 2019-05-26 ENCOUNTER — Other Ambulatory Visit: Payer: Self-pay

## 2019-05-26 ENCOUNTER — Emergency Department (HOSPITAL_COMMUNITY)
Admission: EM | Admit: 2019-05-26 | Discharge: 2019-05-27 | Disposition: A | Discharge status: Skilled Nursing Facility | Payer: Medicare Other | Attending: Emergency Medicine | Admitting: Emergency Medicine

## 2019-05-26 DIAGNOSIS — W19XXXA Unspecified fall, initial encounter: Secondary | ICD-10-CM

## 2019-05-26 DIAGNOSIS — S0083XA Contusion of other part of head, initial encounter: Secondary | ICD-10-CM | POA: Insufficient documentation

## 2019-05-26 DIAGNOSIS — S0990XA Unspecified injury of head, initial encounter: Secondary | ICD-10-CM | POA: Diagnosis not present

## 2019-05-26 DIAGNOSIS — Y939 Activity, unspecified: Secondary | ICD-10-CM | POA: Diagnosis not present

## 2019-05-26 DIAGNOSIS — N183 Chronic kidney disease, stage 3 (moderate): Secondary | ICD-10-CM | POA: Diagnosis not present

## 2019-05-26 DIAGNOSIS — Y92122 Bedroom in nursing home as the place of occurrence of the external cause: Secondary | ICD-10-CM | POA: Diagnosis not present

## 2019-05-26 DIAGNOSIS — F339 Major depressive disorder, recurrent, unspecified: Secondary | ICD-10-CM | POA: Diagnosis not present

## 2019-05-26 DIAGNOSIS — E119 Type 2 diabetes mellitus without complications: Secondary | ICD-10-CM | POA: Diagnosis not present

## 2019-05-26 DIAGNOSIS — Z79899 Other long term (current) drug therapy: Secondary | ICD-10-CM | POA: Diagnosis not present

## 2019-05-26 DIAGNOSIS — F0390 Unspecified dementia without behavioral disturbance: Secondary | ICD-10-CM | POA: Insufficient documentation

## 2019-05-26 DIAGNOSIS — S0003XA Contusion of scalp, initial encounter: Secondary | ICD-10-CM | POA: Diagnosis not present

## 2019-05-26 DIAGNOSIS — I10 Essential (primary) hypertension: Secondary | ICD-10-CM | POA: Diagnosis not present

## 2019-05-26 DIAGNOSIS — Y999 Unspecified external cause status: Secondary | ICD-10-CM | POA: Insufficient documentation

## 2019-05-26 DIAGNOSIS — R278 Other lack of coordination: Secondary | ICD-10-CM | POA: Diagnosis not present

## 2019-05-26 DIAGNOSIS — R2681 Unsteadiness on feet: Secondary | ICD-10-CM | POA: Diagnosis not present

## 2019-05-26 DIAGNOSIS — R531 Weakness: Secondary | ICD-10-CM | POA: Diagnosis not present

## 2019-05-26 DIAGNOSIS — F419 Anxiety disorder, unspecified: Secondary | ICD-10-CM | POA: Diagnosis not present

## 2019-05-26 DIAGNOSIS — Z794 Long term (current) use of insulin: Secondary | ICD-10-CM | POA: Diagnosis not present

## 2019-05-26 DIAGNOSIS — W1830XA Fall on same level, unspecified, initial encounter: Secondary | ICD-10-CM | POA: Diagnosis not present

## 2019-05-26 DIAGNOSIS — S199XXA Unspecified injury of neck, initial encounter: Secondary | ICD-10-CM | POA: Diagnosis not present

## 2019-05-26 DIAGNOSIS — S0081XA Abrasion of other part of head, initial encounter: Secondary | ICD-10-CM | POA: Insufficient documentation

## 2019-05-26 DIAGNOSIS — I959 Hypotension, unspecified: Secondary | ICD-10-CM | POA: Diagnosis not present

## 2019-05-26 DIAGNOSIS — R1311 Dysphagia, oral phase: Secondary | ICD-10-CM | POA: Diagnosis not present

## 2019-05-26 DIAGNOSIS — E1169 Type 2 diabetes mellitus with other specified complication: Secondary | ICD-10-CM | POA: Diagnosis not present

## 2019-05-26 LAB — CBC WITH DIFFERENTIAL/PLATELET
Abs Immature Granulocytes: 0.04 10*3/uL (ref 0.00–0.07)
Basophils Absolute: 0 10*3/uL (ref 0.0–0.1)
Basophils Relative: 0 %
Eosinophils Absolute: 0.2 10*3/uL (ref 0.0–0.5)
Eosinophils Relative: 2 %
HCT: 33.9 % — ABNORMAL LOW (ref 36.0–46.0)
Hemoglobin: 11.3 g/dL — ABNORMAL LOW (ref 12.0–15.0)
Immature Granulocytes: 0 %
Lymphocytes Relative: 12 %
Lymphs Abs: 1.2 10*3/uL (ref 0.7–4.0)
MCH: 29.8 pg (ref 26.0–34.0)
MCHC: 33.3 g/dL (ref 30.0–36.0)
MCV: 89.4 fL (ref 80.0–100.0)
Monocytes Absolute: 0.6 10*3/uL (ref 0.1–1.0)
Monocytes Relative: 7 %
Neutro Abs: 7.5 10*3/uL (ref 1.7–7.7)
Neutrophils Relative %: 79 %
Platelets: 170 10*3/uL (ref 150–400)
RBC: 3.79 MIL/uL — ABNORMAL LOW (ref 3.87–5.11)
RDW: 14.5 % (ref 11.5–15.5)
WBC: 9.6 10*3/uL (ref 4.0–10.5)
nRBC: 0 % (ref 0.0–0.2)

## 2019-05-26 LAB — BASIC METABOLIC PANEL
Anion gap: 10 (ref 5–15)
BUN: 24 mg/dL — ABNORMAL HIGH (ref 8–23)
CO2: 25 mmol/L (ref 22–32)
Calcium: 9.1 mg/dL (ref 8.9–10.3)
Chloride: 103 mmol/L (ref 98–111)
Creatinine, Ser: 0.98 mg/dL (ref 0.44–1.00)
GFR calc Af Amer: >60 mL/min (ref >60)
GFR calc non Af Amer: 54 mL/min — ABNORMAL LOW (ref >60)
Glucose, Bld: 158 mg/dL — ABNORMAL HIGH (ref 70–99)
Potassium: 3.9 mmol/L (ref 3.5–5.1)
Sodium: 138 mmol/L (ref 135–145)

## 2019-05-26 NOTE — ED Triage Notes (Addendum)
Pt BIB EMS from [facility]. Pt has hx of dementia, and  Alzheimers and is poor historian. Facility states pt slid out of chair and hit head on floor. Pt has hematoma to L forehead. Facility reports laceration to L forehead. Pt is not on blood thinners.   132/85 99% RA

## 2019-05-26 NOTE — ED Provider Notes (Signed)
[facility]-EMERGENCY DEPT Provider Note   CSN: [id number] Arrival date & time: 05/26/19  1845     History   Chief Complaint Chief Complaint  Patient presents with   Fall   Head Laceration    HPI Jillian Hart is a 83 y.o. female who presents with a fall and a head injury.  Past medical history significant for diabetes, Alzheimer's, hydrocephalus.  Patient's daughter is at bedside and assists with history.  She is a resident at [facility] and had an unwitnessed fall out of bed today.  The nursing staff there was not able to give any details of the injury.  Her daughter states that she has not really been able to see her mother since March due to COVID but is concerned about her care at the SNF.  She has noted that the patient has been declining over the past couple of months and has not been eating and drinking well.  She has had 2 falls in the past week.  She is also had approximately 30 pound weight loss over the past couple months.  Patient is unable to contribute any details to her history.  She denies any significant pain.  She is normally not ambulatory.  Her daughter states that she is wheelchair-bound  Level 5 caveat due to dementia    HPI  Past Medical History:  Diagnosis Date   Alzheimer disease (HCC)    Diabetes mellitus    Hydrocephalus [facility](HCC)     Patient Active Problem List   Diagnosis Date Noted   CLOSED FRACTURE OF METATARSAL BONE 06/23/2009    History reviewed. No pertinent surgical history.   OB History    Gravida  1   Para  1   Term  1   Preterm      AB      Living  1     SAB      TAB      Ectopic      Multiple      Live Births               Home Medications    Prior to Admission medications   Medication Sig Start Date End Date Taking? Authorizing Provider  acetaminophen (TYLENOL) 500 MG tablet Take 500 mg by mouth 2 (two) times daily.    [provider]  aspirin EC 81 MG  tablet Take 81 mg by mouth every morning.    [provider]  atorvastatin (LIPITOR) 40 MG tablet Take 40 mg by mouth daily.    [provider]  buPROPion (WELLBUTRIN XL) 150 MG 24 hr tablet Take 150 mg by mouth every morning.    [provider]  donepezil (ARICEPT) 10 MG tablet Take 10 mg by mouth at bedtime.    [provider]  insulin detemir (LEVEMIR) 100 UNIT/ML injection Inject 50 Units into the skin every morning.    [provider]  insulin lispro (HUMALOG) 100 UNIT/ML KiwkPen Inject 20 Units into the skin every evening.     [provider]  lisinopril (PRINIVIL,ZESTRIL) 10 MG tablet Take 10 mg by mouth daily.    [provider]  LORazepam (ATIVAN) 0.5 MG tablet Take 0.25 mg by mouth daily as needed for anxiety.    [provider]  ondansetron (ZOFRAN) 4 MG tablet Take 1 tablet (4 mg total) by mouth every 6 (six) hours. Patient not taking: Reported on 03/14/2015 11/29/14   Gilda CreasePollina, Christopher J, MD  potassium chloride (MICRO-K) 10 MEQ CR capsule Take 10 mEq by mouth daily.    [provider]  potassium chloride SA (K-DUR,KLOR-CON) 20 MEQ tablet Take 1 tablet (20 mEq total) by mouth daily. Patient taking differently: Take 20 mEq by mouth daily. For 7 days 11/29/14   Orpah Greek, MD  rosuvastatin (CRESTOR) 20 MG tablet Take 20 mg by mouth every morning.    [provider]  traMADol (ULTRAM) 50 MG tablet Take 1 tablet (50 mg total) by mouth every 6 (six) hours as needed for pain. Patient not taking: Reported on 11/29/2014 02/25/13   Orpah Greek, MD    Family History Family History  Problem Relation Age of Onset   Cancer Mother    Stroke Father     Social History Social History   Tobacco Use   Smoking status: Never Smoker   Smokeless tobacco: Never Used  Substance Use Topics   Alcohol use: No   Drug use: No     Allergies   Patient has no known  allergies.   Review of Systems Review of Systems  Unable to perform ROS: Dementia     Physical Exam Updated Vital Signs BP 135/66    Pulse 74    Temp 98.8 F (37.1 C) (Oral)    Resp 18    SpO2 98%   Physical Exam Vitals signs and nursing note reviewed.  Constitutional:      General: She is not in acute distress.    Appearance: Normal appearance. She is well-developed. She is not ill-appearing.     Comments: Calm, cooperative.  No acute distress.  Pleasantly confused  HENT:     Head: Normocephalic.     Comments: Large hematoma over the left forehead with small abrasion Eyes:     General: No scleral icterus.       Right eye: No discharge.        Left eye: No discharge.     Conjunctiva/sclera: Conjunctivae normal.     Pupils: Pupils are equal, round, and reactive to light.  Neck:     Musculoskeletal: Normal range of motion.  Cardiovascular:     Rate and Rhythm: Normal rate and regular rhythm.  Pulmonary:     Effort: Pulmonary effort is normal. No respiratory distress.     Breath sounds: Normal breath sounds.  Abdominal:     General: There is no distension.     Palpations: Abdomen is soft.     Tenderness: There is no abdominal tenderness.  Musculoskeletal:     Comments: Moving all extremities normally  Skin:    General: Skin is warm and dry.  Neurological:     Mental Status: She is alert. She is disoriented.  Psychiatric:        Behavior: Behavior normal.      ED Treatments / Results  Labs (all labs ordered are listed, but only abnormal results are displayed) Labs Reviewed  BASIC METABOLIC PANEL - Abnormal; Notable for the following components:      Result Value   Glucose, Bld 158 (*)    BUN 24 (*)    GFR calc non Af Amer 54 (*)    All other components within normal limits  CBC WITH DIFFERENTIAL/PLATELET - Abnormal; Notable for the following components:   RBC 3.79 (*)    Hemoglobin 11.3 (*)    HCT 33.9 (*)    All other components within normal limits   URINALYSIS, ROUTINE W REFLEX MICROSCOPIC - Abnormal; Notable for  the following components:   Ketones, ur 5 (*)    All other components within normal limits    EKG None  Radiology Ct Head Wo Contrast  Result Date: 05/26/2019 CLINICAL DATA:  Head trauma. Slid out of chair and hit head on floor. Left forehead hematoma and laceration. History of dementia. EXAM: CT HEAD WITHOUT CONTRAST CT CERVICAL SPINE WITHOUT CONTRAST TECHNIQUE: Multidetector CT imaging of the head and cervical spine was performed following the standard protocol without intravenous contrast. Multiplanar CT image reconstructions of the cervical spine were also generated. COMPARISON:  Head CT 11/29/2014 FINDINGS: CT HEAD FINDINGS Brain: There is no evidence of acute infarct, intracranial hemorrhage, mass, midline shift, or extra-axial fluid collection. A small chronic right frontoparietal cortical infarct is new from the prior study. Small chronic bilateral cerebellar infarcts are unchanged. Ventriculomegaly is unchanged and felt to reflect central predominant cerebral atrophy of a moderate degree rather than hydrocephalus. Bilateral cerebral white matter hypodensities are stable to mildly increased and nonspecific but compatible with mild chronic small vessel ischemic disease. Vascular: Calcified atherosclerosis at the skull base. No hyperdense vessel. Skull: No fracture or focal osseous lesion. Hyperostosis frontalis. Sinuses/Orbits: Visualized paranasal sinuses and mastoid air cells are clear. Orbits are unremarkable. Other: Moderate left frontal scalp hematoma. CT CERVICAL SPINE FINDINGS Alignment: Mild reversal of the normal cervical lordosis. Trace anterolisthesis of C3 on C4, likely facet mediated. Mild right convex curvature of the cervical spine. Skull base and vertebrae: No acute fracture or suspicious osseous lesion. Soft tissues and spinal canal: No prevertebral fluid or swelling. No visible canal hematoma. Disc levels: Moderate  mid to lower cervical spine disc degeneration. Asymmetric left-sided facet arthrosis, severe at C3-4. Moderate multilevel neural foraminal stenosis. Upper chest: Clear lung apices. Other: Subcentimeter thyroid nodules. IMPRESSION: 1. No evidence of acute intracranial abnormality. 2. Left frontal scalp hematoma. 3. 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 4. No evidence of acute cervical spine fracture. Electronically Signed   By: Sebastian AcheAllen  Grady M.D.   On: 05/26/2019 20:58   Ct Cervical Spine Wo Contrast  Result Date: 05/26/2019 CLINICAL DATA:  Head trauma. Slid out of chair and hit head on floor. Left forehead hematoma and laceration. History of dementia. EXAM: CT HEAD WITHOUT CONTRAST CT CERVICAL SPINE WITHOUT CONTRAST TECHNIQUE: Multidetector CT imaging of the head and cervical spine was performed following the standard protocol without intravenous contrast. Multiplanar CT image reconstructions of the cervical spine were also generated. COMPARISON:  Head CT 11/29/2014 FINDINGS: CT HEAD FINDINGS Brain: There is no evidence of acute infarct, intracranial hemorrhage, mass, midline shift, or extra-axial fluid collection. A small chronic right frontoparietal cortical infarct is new from the prior study. Small chronic bilateral cerebellar infarcts are unchanged. Ventriculomegaly is unchanged and felt to reflect central predominant cerebral atrophy of a moderate degree rather than hydrocephalus. Bilateral cerebral white matter hypodensities are stable to mildly increased and nonspecific but compatible with mild chronic small vessel ischemic disease. Vascular: Calcified atherosclerosis at the skull base. No hyperdense vessel. Skull: No fracture or focal osseous lesion. Hyperostosis frontalis. Sinuses/Orbits: Visualized paranasal sinuses and mastoid air cells are clear. Orbits are unremarkable. Other: Moderate left frontal scalp hematoma. CT CERVICAL SPINE FINDINGS Alignment: Mild reversal of the  normal cervical lordosis. Trace anterolisthesis of C3 on C4, likely facet mediated. Mild right convex curvature of the cervical spine. Skull base and vertebrae: No acute fracture or suspicious osseous lesion. Soft tissues and spinal canal: No prevertebral fluid or swelling. No visible canal hematoma. Disc levels:  Moderate mid to lower cervical spine disc degeneration. Asymmetric left-sided facet arthrosis, severe at C3-4. Moderate multilevel neural foraminal stenosis. Upper chest: Clear lung apices. Other: Subcentimeter thyroid nodules. IMPRESSION: 1. No evidence of acute intracranial abnormality. 2. Left frontal scalp hematoma. 3. 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 4. No evidence of acute cervical spine fracture. Electronically Signed   By: Sebastian AcheAllen  Grady M.D.   On: 05/26/2019 20:58   Dg Chest Port 1 View  Result Date: 05/26/2019 CLINICAL DATA:  Weakness EXAM: PORTABLE CHEST 1 VIEW COMPARISON:  11/02/2012 FINDINGS: Cardiac shadow is stable. Aortic calcifications are seen. The lungs are clear bilaterally. No acute bony abnormality is noted. IMPRESSION: No acute abnormality seen. Electronically Signed   By: Alcide CleverMark  Lukens M.D.   On: 05/26/2019 20:59    Procedures Procedures (including critical care time)  Medications Ordered in ED Medications - No data to display   Initial Impression / Assessment and Plan / ED Course  I have reviewed the triage vital signs and the nursing notes.  Pertinent labs & imaging results that were available during my care of the patient were reviewed by me and considered in my medical decision making (see chart for details).  83 year old female presents after a fall out of bed at her SNF today. She has a large hematoma on exam with a small, non-suturable wound. Local wound care was performed by nursing and a non-stick dressing was applied. Due to daughter's concerned about pt's decline, will obtain labs, UA, CXR in addition to CT of the head and  C-spine.  CT is negative for acute abnormality. CXR is negative. Labs and UA are grossly reassuring. Discussed with patient and her daughter who is POA. She is relieved but somewhat surprised that work up is reassuring. She is comfortable sending pt back to her SNF tonight but does mention that she would like to change facilities. I encouraged her to f/u with her doctor.  Final Clinical Impressions(s) / ED Diagnoses   Final diagnoses:  Injury of head, initial encounter  Fall, initial encounter    ED Discharge Orders    None       Bethel BornGekas, Rico Massar Marie, PA-C 05/27/19 1538    Lorre NickAllen, Anthony, MD 05/30/19 [id number]

## 2019-05-27 DIAGNOSIS — F0391 Unspecified dementia with behavioral disturbance: Secondary | ICD-10-CM | POA: Diagnosis not present

## 2019-05-27 DIAGNOSIS — R627 Adult failure to thrive: Secondary | ICD-10-CM | POA: Diagnosis not present

## 2019-05-27 DIAGNOSIS — W19XXXD Unspecified fall, subsequent encounter: Secondary | ICD-10-CM | POA: Diagnosis not present

## 2019-05-27 DIAGNOSIS — R634 Abnormal weight loss: Secondary | ICD-10-CM | POA: Diagnosis not present

## 2019-05-27 LAB — URINALYSIS, ROUTINE W REFLEX MICROSCOPIC
Bacteria, UA: NONE SEEN
Bilirubin Urine: NEGATIVE
Glucose, UA: NEGATIVE mg/dL
Hgb urine dipstick: NEGATIVE
Ketones, ur: 5 mg/dL — AB
Leukocytes,Ua: NEGATIVE
Nitrite: NEGATIVE
Protein, ur: NEGATIVE mg/dL
Specific Gravity, Urine: 1.02 (ref 1.005–1.030)
pH: 5 (ref 5.0–8.0)

## 2019-05-27 NOTE — ED Notes (Signed)
[facility] called for transport. [facility] called and report given. Paperwork in file at [employer]

## 2019-05-27 NOTE — ED Notes (Signed)
In&out cath completed with NT, Doroteo Bradford, got about 200 mL of urine, dark yellow. Head laceration cleaned with normal saline and wound cleaner, nonadherent dressing applied.

## 2019-05-27 NOTE — Discharge Instructions (Addendum)
Please place ice on the forehead to help with swelling for the next 2 days, then switch to heat Change bandage as needed

## 2019-05-28 ENCOUNTER — Non-Acute Institutional Stay: Payer: Medicare Other | Admitting: Internal Medicine

## 2019-05-28 ENCOUNTER — Other Ambulatory Visit: Payer: Self-pay

## 2019-05-28 DIAGNOSIS — I10 Essential (primary) hypertension: Secondary | ICD-10-CM | POA: Diagnosis not present

## 2019-05-28 DIAGNOSIS — N183 Chronic kidney disease, stage 3 (moderate): Secondary | ICD-10-CM | POA: Diagnosis not present

## 2019-05-28 DIAGNOSIS — E119 Type 2 diabetes mellitus without complications: Secondary | ICD-10-CM | POA: Diagnosis not present

## 2019-05-28 DIAGNOSIS — R2681 Unsteadiness on feet: Secondary | ICD-10-CM | POA: Diagnosis not present

## 2019-05-28 DIAGNOSIS — Z515 Encounter for palliative care: Secondary | ICD-10-CM

## 2019-05-28 DIAGNOSIS — F419 Anxiety disorder, unspecified: Secondary | ICD-10-CM | POA: Diagnosis not present

## 2019-05-28 DIAGNOSIS — F0390 Unspecified dementia without behavioral disturbance: Secondary | ICD-10-CM | POA: Diagnosis not present

## 2019-05-28 DIAGNOSIS — R278 Other lack of coordination: Secondary | ICD-10-CM | POA: Diagnosis not present

## 2019-05-28 DIAGNOSIS — R1311 Dysphagia, oral phase: Secondary | ICD-10-CM | POA: Diagnosis not present

## 2019-05-28 DIAGNOSIS — E1169 Type 2 diabetes mellitus with other specified complication: Secondary | ICD-10-CM | POA: Diagnosis not present

## 2019-05-28 DIAGNOSIS — F339 Major depressive disorder, recurrent, unspecified: Secondary | ICD-10-CM | POA: Diagnosis not present

## 2019-05-28 NOTE — Progress Notes (Addendum)
Aug 13th, 2020 [facility] Consult Note Telephone: [phone]  Fax: [phone]   *06/04/2019 note updated with information provided by daughter Lattie Haw and nursing staff at [facility].   Due to the current COVID-19 infection/crises, the patient and family prefer, and have given their verbal consent for, a provider visit via telehealth, from my office. HIPPA policies of confidentially were discussed.  PATIENT NAME: Jillian Hart DOB: 1935-10-17 MRN: [id number] [facility] [id number] (admit date 02/28/2018)   PRIMARY CARE PROVIDER:   Kelton Pillar, Melanie Crazier, MD [address] [place],  [place] [address]   REFERRING PROVIDER:  Suzzanne Cloud, PA/Dr. Wenda Low   RESPONSIBLE PARTY: (dtr) Micah Flesher [phone], ([phone]. (S-I-L) Beryl Meager [phone], ([phone]. English as a second language teacher Other) Rhys Martini [phone], ([phone]   ASSESSMENT / RECOMMENDATIONS:  1. Advance Care Planning: a. Directives: DNR  (Confirmed with daughter) b. Goals of Care: Comfort. Daughter has expresses to SW that she would be amenable to hospice services, should patient qualify.   2. Cognitive / Functional status: Patient with significant cognitive and functional decline over these past 5 months. Daughter reports that patient has flatter affect and no longer initiates conversation. Decreased conversation so that she speaks just a handful of words when daughter visits (window visits d/t COVID). Five months ago, patient was able to transfer and ambulate (with walker) independently; now needing 1 person assist to transfer and is wheelchair bound. Patient fell out of her wheelchair 2  weeks ago. Evaluated at the ER for R forehead hematoma. Patient now needs to be fed; staff report consumption of 50% of meals. Her current weight (05/30/2019) is 94.6lbs, which is a loss of 22.4lbs (19% of body weight) over the previous 5 months. Previously  continent of bowel and bladder, she is now dependent for hygiene, dressing, and is incontinent.    3. Family Supports: Involved daughter who reported to the ER when patient sent there. Daughter has been visiting patient at the facility with window visits.    4. Follow up Palliative Care Visit: Q month to monitor for ongoing decline.    I spent 60 minutes providing this consultation from 3pm to 4pm. More than 50% of the time in this consultation was spent coordinating communication.    HISTORY OF PRESENT ILLNESS:  Jillian Hart is an 83 y.o. year old female with h/o Alzheimers dementia, hydrocephalus, CKD (Aug 2020 Creatine 0.98, GRF 54), DM type 2, HLD, anxiety, depression, HTN, and dysphagia. She is s/p an ER visit 05/26/2019 for evaluation of a fall and head injury. A head CT and CXR were neg for acute abnormality. UA/labs without significant abnormality. Palliative Care was asked to help address goals of care.    CODE STATUS: Full   PPS: weak 40%   HOSPICE ELIGIBILITY/DIAGNOSIS: TBD. Facility SW reports that patient's daughter is amenable to hospice services should patient be eligible.    PAST MEDICAL HISTORY:      Past Medical History:  Diagnosis Date   Alzheimer disease ([place])     Diabetes mellitus     Hydrocephalus ([place])      SOCIAL HX:  Social History        Tobacco Use   Smoking status: Never Smoker   Smokeless tobacco: Never Used  Substance Use Topics   Alcohol use: No     ALLERGIES: No Known Allergies   PERTINENT MEDICATIONS:      Outpatient  Encounter Medications as of 05/28/2019  Medication Sig   acetaminophen (TYLENOL) 500 MG tablet Take 500 mg by mouth 2 (two) times daily.   aspirin EC 81 MG tablet Take 81 mg by mouth every morning.   atorvastatin (LIPITOR) 40 MG tablet Take 40 mg by mouth daily.   buPROPion (WELLBUTRIN XL) 150 MG 24 hr tablet Take 150 mg by mouth every morning.   donepezil (ARICEPT) 10 MG tablet Take 10 mg by mouth at bedtime.     insulin detemir (LEVEMIR) 100 UNIT/ML injection Inject 50 Units into the skin every morning.   insulin lispro (HUMALOG) 100 UNIT/ML KiwkPen Inject 20 Units into the skin every evening.    lisinopril (PRINIVIL,ZESTRIL) 10 MG tablet Take 10 mg by mouth daily.   LORazepam (ATIVAN) 0.5 MG tablet Take 0.25 mg by mouth daily as needed for anxiety.   ondansetron (ZOFRAN) 4 MG tablet Take 1 tablet (4 mg total) by mouth every 6 (six) hours. (Patient not taking: Reported on 03/14/2015)   potassium chloride (MICRO-K) 10 MEQ CR capsule Take 10 mEq by mouth daily.   potassium chloride SA (K-DUR,KLOR-CON) 20 MEQ tablet Take 1 tablet (20 mEq total) by mouth daily. (Patient taking differently: Take 20 mEq by mouth daily. For 7 days)   rosuvastatin (CRESTOR) 20 MG tablet Take 20 mg by mouth every morning.   traMADol (ULTRAM) 50 MG tablet Take 1 tablet (50 mg total) by mouth every 6 (six) hours as needed for pain. (Patient not taking: Reported on 11/29/2014)    No facility-administered encounter medications on file as of 05/28/2019.      PHYSICAL EXAM:    Limited PE d/t nature of telehealth visit (assisted by facility SW Jillian Hart) Alert and pleasantly conversant (on topic) elderly female sitting on side of bed. Patient mentions her sister was with her during her recent ER visit (actually, it was patients daughter). Facility Child psychotherapistsocial worker is assisting with this audio/visual ZOOM visit.  Patient has a hematoma over her left eye, with bruising.  No dyspnea with conversation. Skin: no rashes Extremities: no obvious deformities or contractures. No LE edema. Neurological: Weakness but otherwise non-focal.   Anselm LisMary P Maudine Kluesner, NP

## 2019-05-30 ENCOUNTER — Encounter: Payer: Self-pay | Admitting: Internal Medicine

## 2019-05-30 DIAGNOSIS — E119 Type 2 diabetes mellitus without complications: Secondary | ICD-10-CM | POA: Diagnosis not present

## 2019-05-30 DIAGNOSIS — F419 Anxiety disorder, unspecified: Secondary | ICD-10-CM | POA: Diagnosis not present

## 2019-05-30 DIAGNOSIS — F0391 Unspecified dementia with behavioral disturbance: Secondary | ICD-10-CM | POA: Diagnosis not present

## 2019-05-30 DIAGNOSIS — F339 Major depressive disorder, recurrent, unspecified: Secondary | ICD-10-CM | POA: Diagnosis not present

## 2019-05-31 DIAGNOSIS — R2681 Unsteadiness on feet: Secondary | ICD-10-CM | POA: Diagnosis not present

## 2019-05-31 DIAGNOSIS — F419 Anxiety disorder, unspecified: Secondary | ICD-10-CM | POA: Diagnosis not present

## 2019-05-31 DIAGNOSIS — E119 Type 2 diabetes mellitus without complications: Secondary | ICD-10-CM | POA: Diagnosis not present

## 2019-05-31 DIAGNOSIS — R278 Other lack of coordination: Secondary | ICD-10-CM | POA: Diagnosis not present

## 2019-05-31 DIAGNOSIS — I10 Essential (primary) hypertension: Secondary | ICD-10-CM | POA: Diagnosis not present

## 2019-05-31 DIAGNOSIS — E1169 Type 2 diabetes mellitus with other specified complication: Secondary | ICD-10-CM | POA: Diagnosis not present

## 2019-05-31 DIAGNOSIS — F0390 Unspecified dementia without behavioral disturbance: Secondary | ICD-10-CM | POA: Diagnosis not present

## 2019-05-31 DIAGNOSIS — R1311 Dysphagia, oral phase: Secondary | ICD-10-CM | POA: Diagnosis not present

## 2019-05-31 DIAGNOSIS — F339 Major depressive disorder, recurrent, unspecified: Secondary | ICD-10-CM | POA: Diagnosis not present

## 2019-05-31 DIAGNOSIS — N183 Chronic kidney disease, stage 3 (moderate): Secondary | ICD-10-CM | POA: Diagnosis not present

## 2019-06-01 DIAGNOSIS — R1311 Dysphagia, oral phase: Secondary | ICD-10-CM | POA: Diagnosis not present

## 2019-06-01 DIAGNOSIS — R2681 Unsteadiness on feet: Secondary | ICD-10-CM | POA: Diagnosis not present

## 2019-06-01 DIAGNOSIS — E1169 Type 2 diabetes mellitus with other specified complication: Secondary | ICD-10-CM | POA: Diagnosis not present

## 2019-06-01 DIAGNOSIS — I10 Essential (primary) hypertension: Secondary | ICD-10-CM | POA: Diagnosis not present

## 2019-06-01 DIAGNOSIS — F0390 Unspecified dementia without behavioral disturbance: Secondary | ICD-10-CM | POA: Diagnosis not present

## 2019-06-01 DIAGNOSIS — N183 Chronic kidney disease, stage 3 (moderate): Secondary | ICD-10-CM | POA: Diagnosis not present

## 2019-06-01 DIAGNOSIS — F419 Anxiety disorder, unspecified: Secondary | ICD-10-CM | POA: Diagnosis not present

## 2019-06-01 DIAGNOSIS — R278 Other lack of coordination: Secondary | ICD-10-CM | POA: Diagnosis not present

## 2019-06-01 DIAGNOSIS — F339 Major depressive disorder, recurrent, unspecified: Secondary | ICD-10-CM | POA: Diagnosis not present

## 2019-06-01 DIAGNOSIS — E119 Type 2 diabetes mellitus without complications: Secondary | ICD-10-CM | POA: Diagnosis not present

## 2019-06-02 DIAGNOSIS — R627 Adult failure to thrive: Secondary | ICD-10-CM | POA: Diagnosis not present

## 2019-06-02 DIAGNOSIS — R634 Abnormal weight loss: Secondary | ICD-10-CM | POA: Diagnosis not present

## 2019-06-02 DIAGNOSIS — W19XXXD Unspecified fall, subsequent encounter: Secondary | ICD-10-CM | POA: Diagnosis not present

## 2019-06-02 DIAGNOSIS — F0391 Unspecified dementia with behavioral disturbance: Secondary | ICD-10-CM | POA: Diagnosis not present

## 2019-06-03 DIAGNOSIS — N183 Chronic kidney disease, stage 3 (moderate): Secondary | ICD-10-CM | POA: Diagnosis not present

## 2019-06-03 DIAGNOSIS — I10 Essential (primary) hypertension: Secondary | ICD-10-CM | POA: Diagnosis not present

## 2019-06-03 DIAGNOSIS — E119 Type 2 diabetes mellitus without complications: Secondary | ICD-10-CM | POA: Diagnosis not present

## 2019-06-03 DIAGNOSIS — F339 Major depressive disorder, recurrent, unspecified: Secondary | ICD-10-CM | POA: Diagnosis not present

## 2019-06-03 DIAGNOSIS — F419 Anxiety disorder, unspecified: Secondary | ICD-10-CM | POA: Diagnosis not present

## 2019-06-03 DIAGNOSIS — F0390 Unspecified dementia without behavioral disturbance: Secondary | ICD-10-CM | POA: Diagnosis not present

## 2019-06-03 DIAGNOSIS — R278 Other lack of coordination: Secondary | ICD-10-CM | POA: Diagnosis not present

## 2019-06-03 DIAGNOSIS — R2681 Unsteadiness on feet: Secondary | ICD-10-CM | POA: Diagnosis not present

## 2019-06-03 DIAGNOSIS — R1311 Dysphagia, oral phase: Secondary | ICD-10-CM | POA: Diagnosis not present

## 2019-06-03 DIAGNOSIS — E1169 Type 2 diabetes mellitus with other specified complication: Secondary | ICD-10-CM | POA: Diagnosis not present

## 2019-06-04 DIAGNOSIS — I10 Essential (primary) hypertension: Secondary | ICD-10-CM | POA: Diagnosis not present

## 2019-06-04 DIAGNOSIS — F0390 Unspecified dementia without behavioral disturbance: Secondary | ICD-10-CM | POA: Diagnosis not present

## 2019-06-04 DIAGNOSIS — R1311 Dysphagia, oral phase: Secondary | ICD-10-CM | POA: Diagnosis not present

## 2019-06-04 DIAGNOSIS — F339 Major depressive disorder, recurrent, unspecified: Secondary | ICD-10-CM | POA: Diagnosis not present

## 2019-06-04 DIAGNOSIS — N183 Chronic kidney disease, stage 3 (moderate): Secondary | ICD-10-CM | POA: Diagnosis not present

## 2019-06-04 DIAGNOSIS — R278 Other lack of coordination: Secondary | ICD-10-CM | POA: Diagnosis not present

## 2019-06-04 DIAGNOSIS — E1169 Type 2 diabetes mellitus with other specified complication: Secondary | ICD-10-CM | POA: Diagnosis not present

## 2019-06-04 DIAGNOSIS — E119 Type 2 diabetes mellitus without complications: Secondary | ICD-10-CM | POA: Diagnosis not present

## 2019-06-04 DIAGNOSIS — F419 Anxiety disorder, unspecified: Secondary | ICD-10-CM | POA: Diagnosis not present

## 2019-06-04 DIAGNOSIS — R2681 Unsteadiness on feet: Secondary | ICD-10-CM | POA: Diagnosis not present

## 2019-06-05 ENCOUNTER — Telehealth: Payer: Self-pay | Admitting: Internal Medicine

## 2019-06-05 DIAGNOSIS — R634 Abnormal weight loss: Secondary | ICD-10-CM | POA: Diagnosis not present

## 2019-06-05 DIAGNOSIS — N183 Chronic kidney disease, stage 3 (moderate): Secondary | ICD-10-CM | POA: Diagnosis not present

## 2019-06-05 DIAGNOSIS — F419 Anxiety disorder, unspecified: Secondary | ICD-10-CM | POA: Diagnosis not present

## 2019-06-05 DIAGNOSIS — I739 Peripheral vascular disease, unspecified: Secondary | ICD-10-CM | POA: Diagnosis not present

## 2019-06-05 DIAGNOSIS — F0391 Unspecified dementia with behavioral disturbance: Secondary | ICD-10-CM | POA: Diagnosis not present

## 2019-06-05 DIAGNOSIS — F0390 Unspecified dementia without behavioral disturbance: Secondary | ICD-10-CM | POA: Diagnosis not present

## 2019-06-05 DIAGNOSIS — R2681 Unsteadiness on feet: Secondary | ICD-10-CM | POA: Diagnosis not present

## 2019-06-05 DIAGNOSIS — B351 Tinea unguium: Secondary | ICD-10-CM | POA: Diagnosis not present

## 2019-06-05 DIAGNOSIS — E1122 Type 2 diabetes mellitus with diabetic chronic kidney disease: Secondary | ICD-10-CM | POA: Diagnosis not present

## 2019-06-05 DIAGNOSIS — I10 Essential (primary) hypertension: Secondary | ICD-10-CM | POA: Diagnosis not present

## 2019-06-05 DIAGNOSIS — F339 Major depressive disorder, recurrent, unspecified: Secondary | ICD-10-CM | POA: Diagnosis not present

## 2019-06-05 DIAGNOSIS — E1159 Type 2 diabetes mellitus with other circulatory complications: Secondary | ICD-10-CM | POA: Diagnosis not present

## 2019-06-05 DIAGNOSIS — Q845 Enlarged and hypertrophic nails: Secondary | ICD-10-CM | POA: Diagnosis not present

## 2019-06-05 DIAGNOSIS — F322 Major depressive disorder, single episode, severe without psychotic features: Secondary | ICD-10-CM | POA: Diagnosis not present

## 2019-06-05 DIAGNOSIS — E119 Type 2 diabetes mellitus without complications: Secondary | ICD-10-CM | POA: Diagnosis not present

## 2019-06-05 DIAGNOSIS — E1169 Type 2 diabetes mellitus with other specified complication: Secondary | ICD-10-CM | POA: Diagnosis not present

## 2019-06-05 DIAGNOSIS — R627 Adult failure to thrive: Secondary | ICD-10-CM | POA: Diagnosis not present

## 2019-06-05 DIAGNOSIS — R1311 Dysphagia, oral phase: Secondary | ICD-10-CM | POA: Diagnosis not present

## 2019-06-05 DIAGNOSIS — G309 Alzheimer's disease, unspecified: Secondary | ICD-10-CM | POA: Diagnosis not present

## 2019-06-05 DIAGNOSIS — E46 Unspecified protein-calorie malnutrition: Secondary | ICD-10-CM | POA: Diagnosis not present

## 2019-06-05 DIAGNOSIS — R278 Other lack of coordination: Secondary | ICD-10-CM | POA: Diagnosis not present

## 2019-06-05 NOTE — Telephone Encounter (Signed)
11:20am  TC to patient's daughter Lattie Haw, with updates. I have spoken with Lauren (LCSW) at [facility]. Lauren in planning to submit a hospice referral. I explained in brief with Lattie Haw the scope of hospice services. Lattie Haw is agreeable.  Violeta Gelinas NP [facility] ([place]) [phone]

## 2019-06-07 ENCOUNTER — Emergency Department (HOSPITAL_COMMUNITY)
Admission: EM | Admit: 2019-06-07 | Discharge: 2019-06-07 | Disposition: A | Discharge status: Home / Self Care | Payer: Medicare Other | Attending: Emergency Medicine | Admitting: Emergency Medicine

## 2019-06-07 ENCOUNTER — Encounter (HOSPITAL_COMMUNITY): Payer: Self-pay

## 2019-06-07 ENCOUNTER — Other Ambulatory Visit: Payer: Self-pay

## 2019-06-07 DIAGNOSIS — S0003XA Contusion of scalp, initial encounter: Secondary | ICD-10-CM | POA: Diagnosis not present

## 2019-06-07 DIAGNOSIS — E119 Type 2 diabetes mellitus without complications: Secondary | ICD-10-CM | POA: Diagnosis not present

## 2019-06-07 DIAGNOSIS — G309 Alzheimer's disease, unspecified: Secondary | ICD-10-CM | POA: Diagnosis not present

## 2019-06-07 DIAGNOSIS — Y929 Unspecified place or not applicable: Secondary | ICD-10-CM | POA: Insufficient documentation

## 2019-06-07 DIAGNOSIS — Y939 Activity, unspecified: Secondary | ICD-10-CM | POA: Insufficient documentation

## 2019-06-07 DIAGNOSIS — Z79899 Other long term (current) drug therapy: Secondary | ICD-10-CM | POA: Diagnosis not present

## 2019-06-07 DIAGNOSIS — R627 Adult failure to thrive: Secondary | ICD-10-CM | POA: Diagnosis not present

## 2019-06-07 DIAGNOSIS — Z7982 Long term (current) use of aspirin: Secondary | ICD-10-CM | POA: Diagnosis not present

## 2019-06-07 DIAGNOSIS — Y999 Unspecified external cause status: Secondary | ICD-10-CM | POA: Diagnosis not present

## 2019-06-07 DIAGNOSIS — S0990XA Unspecified injury of head, initial encounter: Secondary | ICD-10-CM | POA: Diagnosis present

## 2019-06-07 DIAGNOSIS — W19XXXA Unspecified fall, initial encounter: Secondary | ICD-10-CM | POA: Diagnosis not present

## 2019-06-07 DIAGNOSIS — L299 Pruritus, unspecified: Secondary | ICD-10-CM | POA: Diagnosis not present

## 2019-06-07 NOTE — ED Provider Notes (Signed)
[facility]-EMERGENCY DEPT Provider Note   CSN: [id number] Arrival date & time: 06/07/19  1122     History   Chief Complaint Chief Complaint  Patient presents with  . Fall    HPI Jillian Hart is a 83 y.o. female.     HPI   83 year old female brought in for repeat evaluation by request of her daughter.  Patient fell approximately 2 weeks ago.  Sustained a forehead hematoma.  She was evaluated the emergency room at that time.  She had a largely reassuring work-up.  Her hematoma has improved although not resolved.  There was some mild bleeding from it in the past couple days.  No new trauma.  Daughter is primarily concerned about her daughter's care in the facility.  Patient has not been eating.  Normally her daughter will come visit her and assist her with at least some of her meals.  Due to all the COVID restrictions she has been on allowed to do this though.  Daughter is concerned about her most continued decline because of this.  Past Medical History:  Diagnosis Date  . Alzheimer disease (HCC)   . Diabetes mellitus   . Hydrocephalus [facility](HCC)     Patient Active Problem List   Diagnosis Date Noted  . CLOSED FRACTURE OF METATARSAL BONE 06/23/2009    History reviewed. No pertinent surgical history.   OB History    Gravida  1   Para  1   Term  1   Preterm      AB      Living  1     SAB      TAB      Ectopic      Multiple      Live Births               Home Medications    Prior to Admission medications   Medication Sig Start Date End Date Taking? Authorizing Provider  acetaminophen (TYLENOL) 500 MG tablet Take 500 mg by mouth 2 (two) times daily.    [provider]  aspirin EC 81 MG tablet Take 81 mg by mouth every morning.    [provider]  atorvastatin (LIPITOR) 40 MG tablet Take 40 mg by mouth daily.    [provider]  buPROPion (WELLBUTRIN XL) 150 MG 24 hr tablet Take 150 mg by mouth every  morning.    [provider]  donepezil (ARICEPT) 10 MG tablet Take 10 mg by mouth at bedtime.    [provider]  insulin detemir (LEVEMIR) 100 UNIT/ML injection Inject 50 Units into the skin every morning.    [provider]  insulin lispro (HUMALOG) 100 UNIT/ML KiwkPen Inject 20 Units into the skin every evening.     [provider]  lisinopril (PRINIVIL,ZESTRIL) 10 MG tablet Take 10 mg by mouth daily.    [provider]  LORazepam (ATIVAN) 0.5 MG tablet Take 0.25 mg by mouth daily as needed for anxiety.    [provider]  ondansetron (ZOFRAN) 4 MG tablet Take 1 tablet (4 mg total) by mouth every 6 (six) hours. Patient not taking: Reported on 03/14/2015 11/29/14   Gilda CreasePollina, Christopher J, MD  potassium chloride (MICRO-K) 10 MEQ CR capsule Take 10 mEq by mouth daily.    [provider]  potassium chloride SA (K-DUR,KLOR-CON) 20 MEQ tablet Take 1 tablet (20 mEq total) by mouth daily. Patient taking differently: Take 20 mEq by mouth daily. For 7  days 11/29/14   Orpah Greek, MD  rosuvastatin (CRESTOR) 20 MG tablet Take 20 mg by mouth every morning.    [provider]  traMADol (ULTRAM) 50 MG tablet Take 1 tablet (50 mg total) by mouth every 6 (six) hours as needed for pain. Patient not taking: Reported on 11/29/2014 02/25/13   Orpah Greek, MD    Family History Family History  Problem Relation Age of Onset  . Cancer Mother   . Stroke Father     Social History Social History   Tobacco Use  . Smoking status: Never Smoker  . Smokeless tobacco: Never Used  Substance Use Topics  . Alcohol use: No  . Drug use: No     Allergies   Patient has no known allergies.   Review of Systems Review of Systems  Level 5 caveat because of dementia.  Physical Exam Updated Vital Signs BP 111/60   Pulse 73   Temp 98.2 F (36.8 C) (Oral)   Resp 18   SpO2 100%   Physical Exam Vitals signs and nursing note  reviewed.  Constitutional:      General: She is not in acute distress.    Appearance: She is well-developed.  HENT:     Head: Normocephalic.     Comments: Subacute appearing ecchymosis to the face and forehead.  Old appearing hematoma to left forehead.  Skin overlying top of that hematoma is mildly denuded and there is some scab blood there.  Does not appear to be infected.  No midline spinal tenderness. Eyes:     General:        Right eye: No discharge.        Left eye: No discharge.     Conjunctiva/sclera: Conjunctivae normal.  Neck:     Musculoskeletal: Neck supple.  Cardiovascular:     Rate and Rhythm: Normal rate and regular rhythm.     Heart sounds: Normal heart sounds. No murmur. No friction rub. No gallop.   Pulmonary:     Effort: Pulmonary effort is normal. No respiratory distress.     Breath sounds: Normal breath sounds.  Abdominal:     General: There is no distension.     Palpations: Abdomen is soft.     Tenderness: There is no abdominal tenderness.  Musculoskeletal:        General: No tenderness.  Skin:    General: Skin is warm and dry.  Neurological:     Mental Status: She is alert.  Psychiatric:        Behavior: Behavior normal.        Thought Content: Thought content normal.      ED Treatments / Results  Labs (all labs ordered are listed, but only abnormal results are displayed) Labs Reviewed - No data to display  EKG None  Radiology No results found.  Procedures Procedures (including critical care time)  Medications Ordered in ED Medications - No data to display   Initial Impression / Assessment and Plan / ED Course  I have reviewed the triage vital signs and the nursing notes.  Pertinent labs & imaging results that were available during my care of the patient were reviewed by me and considered in my medical decision making (see chart for details).        83yF sent to the ED for evaluation at the request of her daughter. There doesn't  appear to be an acute process here. Her daughter does have a lot of legitimate concerns though. Her  mom has declined significantly over the past several months. Visitors restrictions in the setting of COVID has only exacerbated this decline. I sympathized with her daughter and tried to encourage her. I reviewed her prior ED w/u with her again. I do not think additional testing is indicated at this time w/o significant change in the interim.  She has a persistent scalp hematoma but it's not infected and should continue to improve with simple wound care and more time.  Daughter seems comfortable with that. I think it simply helped her by giving her an outlet to vent some of her frustrations. Unfortunately, there isn't much more we can offer her in the ED setting. She has already been working with hospice. The same restrictions she is concerned about would be in place at other facilities.  Although well intentioned, I agree that we are failing some of our most vulnerable populations with all of the restrictions and the lack of flexibility. I encouraged her to continue to advocate for improved way of handling this pandemic.    Final Clinical Impressions(s) / ED Diagnoses   Final diagnoses:  Hematoma of scalp, initial encounter  Failure to thrive in adult    ED Discharge Orders    None       Raeford RazorKohut, Carrah Eppolito, MD 06/12/19 1049

## 2019-06-07 NOTE — ED Notes (Signed)
Pt leaving with POA, daughter, Mamie Nick.

## 2019-06-07 NOTE — ED Notes (Signed)
ED Provider at bedside. 

## 2019-06-07 NOTE — Discharge Instructions (Addendum)
From my perspective, it is medically appropriate for Jillian Hart to be discharged in the care of her daughter. - Virgel Manifold, MD

## 2019-06-07 NOTE — ED Triage Notes (Addendum)
Pt BIB EMS from [facility]. Pt has hx of dementia, Alzheimers, pt alert to baseline. Pt is poor historian. Pt has hematoma to L forehead from fall 05/26/2019. Pt family requested pt to be sent to ER for another evaluation. Pt not on blood thinners. EMS reports pt was touching and scratching at hematoma L forehead.   BP 108/62 97% RA 98.2 temp

## 2019-06-07 NOTE — ED Notes (Signed)
[facility] called  

## 2019-06-07 NOTE — ED Notes (Signed)
Report given to Cherrie, LPN at [facility].

## 2019-06-07 NOTE — ED Notes (Signed)
Pt's daughter states she is concerned about the hematoma on forehead from fall 2 weeks ago. Daughter also reports that pt has been c/o irritation along jaw line that "feels like sand".

## 2019-06-08 DIAGNOSIS — I10 Essential (primary) hypertension: Secondary | ICD-10-CM | POA: Diagnosis not present

## 2019-06-08 DIAGNOSIS — R1311 Dysphagia, oral phase: Secondary | ICD-10-CM | POA: Diagnosis not present

## 2019-06-08 DIAGNOSIS — F0390 Unspecified dementia without behavioral disturbance: Secondary | ICD-10-CM | POA: Diagnosis not present

## 2019-06-08 DIAGNOSIS — R278 Other lack of coordination: Secondary | ICD-10-CM | POA: Diagnosis not present

## 2019-06-08 DIAGNOSIS — R2681 Unsteadiness on feet: Secondary | ICD-10-CM | POA: Diagnosis not present

## 2019-06-08 DIAGNOSIS — N183 Chronic kidney disease, stage 3 (moderate): Secondary | ICD-10-CM | POA: Diagnosis not present

## 2019-06-08 DIAGNOSIS — F339 Major depressive disorder, recurrent, unspecified: Secondary | ICD-10-CM | POA: Diagnosis not present

## 2019-06-08 DIAGNOSIS — F419 Anxiety disorder, unspecified: Secondary | ICD-10-CM | POA: Diagnosis not present

## 2019-06-08 DIAGNOSIS — E119 Type 2 diabetes mellitus without complications: Secondary | ICD-10-CM | POA: Diagnosis not present

## 2019-06-08 DIAGNOSIS — E1169 Type 2 diabetes mellitus with other specified complication: Secondary | ICD-10-CM | POA: Diagnosis not present

## 2019-06-09 DIAGNOSIS — F419 Anxiety disorder, unspecified: Secondary | ICD-10-CM | POA: Diagnosis not present

## 2019-06-09 DIAGNOSIS — N183 Chronic kidney disease, stage 3 (moderate): Secondary | ICD-10-CM | POA: Diagnosis not present

## 2019-06-09 DIAGNOSIS — F0390 Unspecified dementia without behavioral disturbance: Secondary | ICD-10-CM | POA: Diagnosis not present

## 2019-06-09 DIAGNOSIS — F339 Major depressive disorder, recurrent, unspecified: Secondary | ICD-10-CM | POA: Diagnosis not present

## 2019-06-09 DIAGNOSIS — R2681 Unsteadiness on feet: Secondary | ICD-10-CM | POA: Diagnosis not present

## 2019-06-09 DIAGNOSIS — E1169 Type 2 diabetes mellitus with other specified complication: Secondary | ICD-10-CM | POA: Diagnosis not present

## 2019-06-09 DIAGNOSIS — R1311 Dysphagia, oral phase: Secondary | ICD-10-CM | POA: Diagnosis not present

## 2019-06-09 DIAGNOSIS — R278 Other lack of coordination: Secondary | ICD-10-CM | POA: Diagnosis not present

## 2019-06-09 DIAGNOSIS — I10 Essential (primary) hypertension: Secondary | ICD-10-CM | POA: Diagnosis not present

## 2019-06-09 DIAGNOSIS — E119 Type 2 diabetes mellitus without complications: Secondary | ICD-10-CM | POA: Diagnosis not present

## 2019-06-10 DIAGNOSIS — F0391 Unspecified dementia with behavioral disturbance: Secondary | ICD-10-CM | POA: Diagnosis not present

## 2019-06-10 DIAGNOSIS — W19XXXD Unspecified fall, subsequent encounter: Secondary | ICD-10-CM | POA: Diagnosis not present

## 2019-06-10 DIAGNOSIS — R634 Abnormal weight loss: Secondary | ICD-10-CM | POA: Diagnosis not present

## 2019-06-10 DIAGNOSIS — R627 Adult failure to thrive: Secondary | ICD-10-CM | POA: Diagnosis not present

## 2019-06-12 DIAGNOSIS — R627 Adult failure to thrive: Secondary | ICD-10-CM | POA: Diagnosis not present

## 2019-06-12 DIAGNOSIS — F0391 Unspecified dementia with behavioral disturbance: Secondary | ICD-10-CM | POA: Diagnosis not present

## 2019-06-12 DIAGNOSIS — W19XXXD Unspecified fall, subsequent encounter: Secondary | ICD-10-CM | POA: Diagnosis not present

## 2019-06-12 DIAGNOSIS — R634 Abnormal weight loss: Secondary | ICD-10-CM | POA: Diagnosis not present

## 2019-06-14 DIAGNOSIS — E119 Type 2 diabetes mellitus without complications: Secondary | ICD-10-CM | POA: Diagnosis not present

## 2019-06-14 DIAGNOSIS — F0391 Unspecified dementia with behavioral disturbance: Secondary | ICD-10-CM | POA: Diagnosis not present

## 2019-06-14 DIAGNOSIS — F339 Major depressive disorder, recurrent, unspecified: Secondary | ICD-10-CM | POA: Diagnosis not present

## 2019-06-14 DIAGNOSIS — E785 Hyperlipidemia, unspecified: Secondary | ICD-10-CM | POA: Diagnosis not present

## 2019-06-18 DIAGNOSIS — F322 Major depressive disorder, single episode, severe without psychotic features: Secondary | ICD-10-CM | POA: Diagnosis not present

## 2019-06-18 DIAGNOSIS — G309 Alzheimer's disease, unspecified: Secondary | ICD-10-CM | POA: Diagnosis not present

## 2019-06-18 DIAGNOSIS — E46 Unspecified protein-calorie malnutrition: Secondary | ICD-10-CM | POA: Diagnosis not present
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
## 2021-05-09 ENCOUNTER — Emergency Department (HOSPITAL_COMMUNITY)
Admission: EM | Admit: 2021-05-09 | Discharge: 2021-05-10 | Disposition: A | Discharge status: Home / Self Care | Payer: Medicare (Managed Care) | Attending: Emergency Medicine | Admitting: Emergency Medicine

## 2021-05-09 ENCOUNTER — Emergency Department (HOSPITAL_COMMUNITY): Payer: Medicare (Managed Care)

## 2021-05-09 ENCOUNTER — Other Ambulatory Visit: Payer: Self-pay

## 2021-05-09 DIAGNOSIS — S0993XA Unspecified injury of face, initial encounter: Secondary | ICD-10-CM | POA: Diagnosis not present

## 2021-05-09 DIAGNOSIS — R410 Disorientation, unspecified: Secondary | ICD-10-CM | POA: Diagnosis not present

## 2021-05-09 DIAGNOSIS — E119 Type 2 diabetes mellitus without complications: Secondary | ICD-10-CM | POA: Insufficient documentation

## 2021-05-09 DIAGNOSIS — Y92129 Unspecified place in nursing home as the place of occurrence of the external cause: Secondary | ICD-10-CM | POA: Diagnosis not present

## 2021-05-09 DIAGNOSIS — Z794 Long term (current) use of insulin: Secondary | ICD-10-CM | POA: Insufficient documentation

## 2021-05-09 DIAGNOSIS — Z7982 Long term (current) use of aspirin: Secondary | ICD-10-CM | POA: Diagnosis not present

## 2021-05-09 DIAGNOSIS — G309 Alzheimer's disease, unspecified: Secondary | ICD-10-CM | POA: Insufficient documentation

## 2021-05-09 DIAGNOSIS — W228XXA Striking against or struck by other objects, initial encounter: Secondary | ICD-10-CM | POA: Insufficient documentation
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
## 2021-05-10 NOTE — Discharge Instructions (Addendum)
The CT scan of your face and head were unremarkable showing no broken bones or intracranial abnormality.  Please follow-up with your primary care provider as needed.

## 2022-02-18 ENCOUNTER — Emergency Department (HOSPITAL_COMMUNITY): Payer: Medicare (Managed Care)

## 2022-02-18 ENCOUNTER — Other Ambulatory Visit: Payer: Self-pay

## 2022-02-18 ENCOUNTER — Encounter (HOSPITAL_COMMUNITY): Payer: Self-pay

## 2022-02-18 ENCOUNTER — Emergency Department (HOSPITAL_COMMUNITY)
Admission: EM | Admit: 2022-02-18 | Discharge: 2022-02-18 | Disposition: A | Discharge status: Skilled Nursing Facility | Payer: Medicare (Managed Care) | Attending: Emergency Medicine | Admitting: Emergency Medicine

## 2022-02-18 DIAGNOSIS — R1110 Vomiting, unspecified: Secondary | ICD-10-CM | POA: Diagnosis not present

## 2022-02-18 DIAGNOSIS — F0280 Dementia in other diseases classified elsewhere without behavioral disturbance: Secondary | ICD-10-CM | POA: Insufficient documentation

## 2022-02-18 DIAGNOSIS — Z7982 Long term (current) use of aspirin: Secondary | ICD-10-CM | POA: Diagnosis not present

## 2022-02-18 DIAGNOSIS — W06XXXA Fall from bed, initial encounter: Secondary | ICD-10-CM | POA: Insufficient documentation

## 2022-02-18 DIAGNOSIS — G309 Alzheimer's disease, unspecified: Secondary | ICD-10-CM | POA: Diagnosis not present

## 2022-02-18 DIAGNOSIS — Z794 Long term (current) use of insulin: Secondary | ICD-10-CM | POA: Diagnosis not present

## 2022-02-18 DIAGNOSIS — Y92129 Unspecified place in nursing home as the place of occurrence of the external cause: Secondary | ICD-10-CM | POA: Insufficient documentation

## 2022-02-18 DIAGNOSIS — E119 Type 2 diabetes mellitus without complications: Secondary | ICD-10-CM | POA: Diagnosis not present

## 2022-02-18 DIAGNOSIS — Z043 Encounter for examination and observation following other accident: Secondary | ICD-10-CM | POA: Diagnosis not present

## 2022-02-18 DIAGNOSIS — W19XXXA Unspecified fall, initial encounter: Secondary | ICD-10-CM

## 2022-02-18 LAB — COMPREHENSIVE METABOLIC PANEL
ALT: 18 U/L (ref 0–44)
AST: 22 U/L (ref 15–41)
Albumin: 3 g/dL — ABNORMAL LOW (ref 3.5–5.0)
Alkaline Phosphatase: 130 U/L — ABNORMAL HIGH (ref 38–126)
Anion gap: 6 (ref 5–15)
BUN: 16 mg/dL (ref 8–23)
CO2: 25 mmol/L (ref 22–32)
Calcium: 8.6 mg/dL — ABNORMAL LOW (ref 8.9–10.3)
Chloride: 101 mmol/L (ref 98–111)
Creatinine, Ser: 0.79 mg/dL (ref 0.44–1.00)
GFR, Estimated: >60 mL/min (ref >60)
Glucose, Bld: 285 mg/dL — ABNORMAL HIGH (ref 70–99)
Potassium: 4 mmol/L (ref 3.5–5.1)
Sodium: 132 mmol/L — ABNORMAL LOW (ref 135–145)
Total Bilirubin: 0.6 mg/dL (ref 0.3–1.2)
Total Protein: 6.2 g/dL — ABNORMAL LOW (ref 6.5–8.1)

## 2022-02-18 LAB — CBC WITH DIFFERENTIAL/PLATELET
Abs Immature Granulocytes: 0.09 10*3/uL — ABNORMAL HIGH (ref 0.00–0.07)
Basophils Absolute: 0 10*3/uL (ref 0.0–0.1)
Basophils Relative: 0 %
Eosinophils Absolute: 0.2 10*3/uL (ref 0.0–0.5)
Eosinophils Relative: 1 %
HCT: 33.1 % — ABNORMAL LOW (ref 36.0–46.0)
Hemoglobin: 11.4 g/dL — ABNORMAL LOW (ref 12.0–15.0)
Immature Granulocytes: 1 %
Lymphocytes Relative: 9 %
Lymphs Abs: 1.2 10*3/uL (ref 0.7–4.0)
MCH: 29 pg (ref 26.0–34.0)
MCHC: 34.4 g/dL (ref 30.0–36.0)
MCV: 84.2 fL (ref 80.0–100.0)
Monocytes Absolute: 0.7 10*3/uL (ref 0.1–1.0)
Monocytes Relative: 6 %
Neutro Abs: 10.3 10*3/uL — ABNORMAL HIGH (ref 1.7–7.7)
Neutrophils Relative %: 83 %
Platelets: 173 10*3/uL (ref 150–400)
RBC: 3.93 MIL/uL (ref 3.87–5.11)
RDW: 13.3 % (ref 11.5–15.5)
WBC: 12.5 10*3/uL — ABNORMAL HIGH (ref 4.0–10.5)
nRBC: 0 % (ref 0.0–0.2)

## 2022-02-18 LAB — PROTIME-INR
INR: 1 (ref 0.8–1.2)
Prothrombin Time: 12.6 seconds (ref 11.4–15.2)

## 2022-02-18 NOTE — ED Notes (Signed)
[facility] notified of patient needing transportation back to [facility]. ?

## 2022-02-18 NOTE — ED Notes (Signed)
Patient transported to CT 
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
## 2022-02-18 NOTE — ED Provider Notes (Signed)
?[facility] EMERGENCY DEPARTMENT ?Provider Note ? ? ?CSN: [id number] ?Arrival date & time: 02/18/22  0033 ? ?  ? ?History ? ?Chief Complaint  ?Patient presents with  ? Fall  ? ? ?Jillian Hart is a 86 y.o. female. ? ?HPI ? ?  ? ?This is an 86 year old female who [facility].  It was an unwitnessed fall.  Reportedly hit her forehead.  Patient does not provide any history.  She does not remember falling.  She is oriented to herself and knows that she is in the hospital but is disoriented to specific place and time.  She has no complaints at this time.  There was some report of emesis. ? ?DNR and MOST form at the bedside.  MOST form indicates comfort care. ? ?[facility].  No evidence of anticoagulant use. ? ?Home Medications ?Prior to Admission medications   ?Medication Sig Start Date End Date Taking? Authorizing Provider  ?acetaminophen (TYLENOL) 500 MG tablet Take 500 mg by mouth 2 (two) times daily.    [provider]  ?aspirin EC 81 MG tablet Take 81 mg by mouth every morning.    [provider]  ?atorvastatin (LIPITOR) 40 MG tablet Take 40 mg by mouth daily.    [provider]  ?buPROPion (WELLBUTRIN XL) 150 MG 24 hr tablet Take 150 mg by mouth every morning.    [provider]  ?donepezil (ARICEPT) 10 MG tablet Take 10 mg by mouth at bedtime.    [provider]  ?insulin detemir (LEVEMIR) 100 UNIT/ML injection Inject 50 Units into the skin every morning.    [provider]  ?insulin lispro (HUMALOG) 100 UNIT/ML KiwkPen Inject 20 Units into the skin every evening.     [provider]  ?lisinopril (PRINIVIL,ZESTRIL) 10 MG tablet Take 10 mg by mouth daily.    [provider]  ?LORazepam (ATIVAN) 0.5 MG tablet Take 0.25 mg by mouth daily as needed for anxiety.    [provider]  ?ondansetron (ZOFRAN) 4 MG tablet Take 1 tablet (4 mg total) by mouth every 6 (six) hours. ?Patient not taking:  Reported on 03/14/2015 11/29/14   Jillian Hart, Jillian J, MD  ?potassium chloride (MICRO-K) 10 MEQ CR capsule Take 10 mEq by mouth daily.    [provider]  ?potassium chloride SA (K-DUR,KLOR-CON) 20 MEQ tablet Take 1 tablet (20 mEq total) by mouth daily. ?Patient taking differently: Take 20 mEq by mouth daily. For 7 days 11/29/14   Jillian Hart, Jillian J, MD  ?rosuvastatin (CRESTOR) 20 MG tablet Take 20 mg by mouth every morning.    [provider]  ?traMADol (ULTRAM) 50 MG tablet Take 1 tablet (50 mg total) by mouth every 6 (six) hours as needed for pain. ?Patient not taking: Reported on 11/29/2014 02/25/13   Jillian Hart, Jillian J, MD  ?   ? ?Allergies    ?Patient has no known allergies.   ? ?Review of Systems   ?Review of Systems  ?Unable to perform ROS: Dementia  ? ?Physical Exam ?Updated Vital Signs ?BP 104/89   Pulse 87   Temp 98.3 ?F (36.8 ?C) (Oral)   Resp 17   Ht 1.473 m (4\' 10" )   Wt 50 kg   SpO2 95%   BMI 23.04 kg/m?  ?Physical Exam ?Vitals and nursing note reviewed.  ?Constitutional:   ?   Appearance: She is well-developed.  ?   Comments: Elderly, ABCs intact  ?HENT:  ?  Head: Normocephalic and atraumatic.  ?   Right Ear: Tympanic membrane normal.  ?   Left Ear: Tympanic membrane normal.  ?   Ears:  ?   Comments: No redness over the mastoids ?   Mouth/Throat:  ?   Mouth: Mucous membranes are moist.  ?Eyes:  ?   Pupils: Pupils are equal, round, and reactive to light.  ?Cardiovascular:  ?   Rate and Rhythm: Normal rate and regular rhythm.  ?   Heart sounds: Normal heart sounds.  ?Pulmonary:  ?   Effort: Pulmonary effort is normal. No respiratory distress.  ?   Breath sounds: No wheezing.  ?Abdominal:  ?   Palpations: Abdomen is soft.  ?   Tenderness: There is no abdominal tenderness.  ?Musculoskeletal:     ?   General: No deformity.  ?   Cervical back: Neck supple.  ?   Comments: Normal range of motion bilateral hips and knees, no obvious deformities  ?Skin: ?   General: Skin is warm  and dry.  ?Neurological:  ?   Mental Status: She is alert.  ?   Comments: Oriented times  ?Psychiatric:     ?   Mood and Affect: Mood normal.  ? ? ?ED Results / Procedures / Treatments   ?Labs ?(all labs ordered are listed, but only abnormal results are displayed) ?Labs Reviewed  ?CBC WITH DIFFERENTIAL/PLATELET - Abnormal; Notable for the following components:  ?    Result Value  ? WBC 12.5 (*)   ? Hemoglobin 11.4 (*)   ? HCT 33.1 (*)   ? Neutro Abs 10.3 (*)   ? Abs Immature Granulocytes 0.09 (*)   ? All other components within normal limits  ?COMPREHENSIVE METABOLIC PANEL - Abnormal; Notable for the following components:  ? Sodium 132 (*)   ? Glucose, Bld 285 (*)   ? Calcium 8.6 (*)   ? Total Protein 6.2 (*)   ? Albumin 3.0 (*)   ? Alkaline Phosphatase 130 (*)   ? All other components within normal limits  ?PROTIME-INR  ? ? ?EKG ?None ? ?Radiology ?CT Head Wo Contrast ? ?Result Date: 02/18/2022 ?CLINICAL DATA:  Larey Seat and hit forehead.  Now complaining of vomiting. EXAM: CT HEAD WITHOUT CONTRAST TECHNIQUE: Contiguous axial images were obtained from the base of the skull through the vertex without intravenous contrast. RADIATION DOSE REDUCTION: This exam was performed according to the departmental dose-optimization program which includes automated exposure control, adjustment of the mA and/or kV according to patient size and/or use of iterative reconstruction technique. COMPARISON:  Head CT 05/09/2021. FINDINGS: Brain: There is moderately advanced cerebral atrophy, small vessel disease and atrophic ventriculomegaly with a chronic high right parietal cortical infarct. There is mild-to-moderate cerebellar atrophy with multiple cerebellar lacunar infarcts. No new focal asymmetry is seen concerning for an acute infarct, hemorrhage or mass and there is no midline shift. The basal cisterns are clear. There are benign dural calcifications along superior falx and tentorial incisura. Vascular: Carotid siphons are heavily  calcified. No hyperdense central vessel is seen. Skull: There is no visible scalp hematoma, depressed skull fracture or focal lesion. Sinuses/Orbits: Unremarkable visualized orbital contents. There is interval increased partial opacification of the right ethmoid air cells and right frontal sinus. Increased frothy secretions without significant membrane disease in the sphenoid air cells. Other visible sinuses are clear. There is mild scattered bilateral mastoid fluid also new from the prior study with preservation of the bilateral middle ear aeration. Other: None. IMPRESSION: 1.  No acute intracranial CT findings, depressed skull fractures, or intracranial interval changes. 2. Increased right ethmoid and right frontal sinus disease. 3. Development of scattered bilateral mastoid fluid suggesting mastoiditis. Electronically Signed   By: Almira Bar M.D.   On: 02/18/2022 01:21   ? ?Procedures ?Procedures  ? ? ?Medications Ordered in ED ?Medications - No data to display ? ?ED Course/ Medical Decision Making/ A&P ?  ?                        ?Medical Decision Making ?Amount and/or Complexity of Data Reviewed ?Labs: ordered. ?Radiology: ordered. ? ? ?This patient presents to the ED for concern of fall, this involves an extensive number of treatment options, and is a complaint that carries with it a high risk of complications and morbidity.  The differential diagnosis includes head injury, fracture ? ?MDM:   ? ?This is a 86 year old female with history of dementia who presents following a fall.  She is awake, alert.  She appears at her baseline.  She has no obvious external signs of trauma.  I reviewed her most and DNR forms.  Labs obtained.  CT scan of the head shows no evidence of acute bleed.  Labs are largely unremarkable with a stable hemoglobin.  She is currently on Rocephin at living facility.  Unclear why but that would cover for any UTI. ?Admission considered for N/A ?(Labs, imaging, consults) ? ?Labs: ?I Ordered,  and personally interpreted labs.  The pertinent results include: CBC, BMP ? ?Imaging Studies ordered: ?I ordered imaging studies including CT head negative ?I independently visualized and interpreted imaging. ?I

## 2022-02-18 NOTE — Discharge Instructions (Signed)
CT scan and basic lab work was reassuring. ?

## 2022-02-18 NOTE — ED Notes (Signed)
Lab at bedside

## 2022-05-13 ENCOUNTER — Emergency Department (HOSPITAL_COMMUNITY)
Admission: EM | Admit: 2022-05-13 | Discharge: 2022-05-14 | Disposition: A | Discharge status: Skilled Nursing Facility | Payer: Medicare (Managed Care) | Attending: Emergency Medicine | Admitting: Emergency Medicine

## 2022-05-13 ENCOUNTER — Other Ambulatory Visit: Payer: Self-pay

## 2022-05-13 ENCOUNTER — Encounter (HOSPITAL_COMMUNITY): Payer: Self-pay

## 2022-05-13 ENCOUNTER — Emergency Department (HOSPITAL_COMMUNITY): Payer: Medicare (Managed Care)

## 2022-05-13 DIAGNOSIS — S0990XA Unspecified injury of head, initial encounter: Secondary | ICD-10-CM

## 2022-05-13 DIAGNOSIS — S0101XA Laceration without foreign body of scalp, initial encounter: Secondary | ICD-10-CM | POA: Diagnosis not present

## 2022-05-13 DIAGNOSIS — Y9301 Activity, walking, marching and hiking: Secondary | ICD-10-CM | POA: Insufficient documentation

## 2022-05-13 DIAGNOSIS — Z7982 Long term (current) use of aspirin: Secondary | ICD-10-CM | POA: Insufficient documentation

## 2022-05-13 DIAGNOSIS — W01198A Fall on same level from slipping, tripping and stumbling with subsequent striking against other object, initial encounter: Secondary | ICD-10-CM | POA: Insufficient documentation

## 2022-05-13 DIAGNOSIS — E119 Type 2 diabetes mellitus without complications: Secondary | ICD-10-CM | POA: Insufficient documentation

## 2022-05-13 DIAGNOSIS — G309 Alzheimer's disease, unspecified: Secondary | ICD-10-CM | POA: Diagnosis not present

## 2022-05-13 DIAGNOSIS — Z794 Long term (current) use of insulin: Secondary | ICD-10-CM | POA: Insufficient documentation

## 2022-05-13 DIAGNOSIS — W19XXXA Unspecified fall, initial encounter: Secondary | ICD-10-CM

## 2022-05-13 DIAGNOSIS — Y92129 Unspecified place in nursing home as the place of occurrence of the external cause: Secondary | ICD-10-CM | POA: Insufficient documentation

## 2022-05-13 LAB — BASIC METABOLIC PANEL
Anion gap: 11 (ref 5–15)
BUN: 10 mg/dL (ref 8–23)
CO2: 23 mmol/L (ref 22–32)
Calcium: 8.8 mg/dL — ABNORMAL LOW (ref 8.9–10.3)
Chloride: 103 mmol/L (ref 98–111)
Creatinine, Ser: 0.94 mg/dL (ref 0.44–1.00)
GFR, Estimated: 60 mL/min — ABNORMAL LOW (ref >60)
Glucose, Bld: 152 mg/dL — ABNORMAL HIGH (ref 70–99)
Potassium: 3.4 mmol/L — ABNORMAL LOW (ref 3.5–5.1)
Sodium: 137 mmol/L (ref 135–145)

## 2022-05-13 LAB — CBC
HCT: 35.1 % — ABNORMAL LOW (ref 36.0–46.0)
Hemoglobin: 11.5 g/dL — ABNORMAL LOW (ref 12.0–15.0)
MCH: 27.7 pg (ref 26.0–34.0)
MCHC: 32.8 g/dL (ref 30.0–36.0)
MCV: 84.6 fL (ref 80.0–100.0)
Platelets: 190 10*3/uL (ref 150–400)
RBC: 4.15 MIL/uL (ref 3.87–5.11)
RDW: 13.6 % (ref 11.5–15.5)
WBC: 9.9 10*3/uL (ref 4.0–10.5)
nRBC: 0 % (ref 0.0–0.2)

## 2022-05-13 LAB — CBG MONITORING, ED: Glucose-Capillary: 147 mg/dL — ABNORMAL HIGH (ref 70–99)

## 2022-05-13 MED ORDER — LIDOCAINE-EPINEPHRINE-TETRACAINE (LET) TOPICAL GEL
3.0000 mL | Freq: Once | TOPICAL | Status: AC
Start: 2022-05-13 — End: 2022-05-13
  Administered 2022-05-13: 3 mL via TOPICAL
  Filled 2022-05-13: qty 3

## 2022-05-13 MED ORDER — LIDOCAINE-EPINEPHRINE-TETRACAINE (LET) TOPICAL GEL
3.0000 mL | Freq: Once | TOPICAL | Status: AC
  Administered 2022-05-13: 3 mL via TOPICAL
  Filled 2022-05-13: qty 3

## 2022-05-13 NOTE — ED Notes (Signed)
Pt unable to sign MSE. MOST form says only IV in certain situations. Pt is on comfort care.

## 2022-05-13 NOTE — ED Triage Notes (Signed)
Pt fell and hit her head. 2 cm laceration noted to left side of head. Pt is not on blood thinners.

## 2022-05-13 NOTE — ED Notes (Signed)
Attempted to call [facility] and phone is still saying busy as off the hook.

## 2022-05-13 NOTE — ED Notes (Signed)
[employer] informed [employer] staff that [facility] phone lines have not been working today and the facilities supervisor should be contacting [employer] shortly.

## 2022-05-13 NOTE — ED Notes (Signed)
Due to Safety Plan, Pt is discharged but unable to be sent to the [facility] for safety concerns. This RN made multiple attempts to call [facility] to arrange for transport back to their facility. [facility] are not working. [facility] Secretary called [employer] in hopes of doing wellfare check on [facility] due to phone lines not working and this RN hearing constant 'Busy Signal' when calling.

## 2022-05-13 NOTE — ED Notes (Signed)
Attempted to call report to [facility] and the phone is beeping like it is off the hook.

## 2022-05-13 NOTE — ED Notes (Signed)
[facility] communication called to setup transportation at this time.

## 2022-05-13 NOTE — Discharge Instructions (Addendum)
Staples need to be removed in 7 days.  This may be performed by the nurse practitioner, clinical staff with supervision, or back to the ED.  Further information regarding the staples and wound care has been provided for review.  Monitor the wound for significant redness, warmth, discharge, or other signs of infection.  Return to the ED for new or worsening symptoms.

## 2022-05-13 NOTE — ED Provider Notes (Signed)
[facility] EMERGENCY DEPARTMENT Provider Note   CSN: [id number] Arrival date & time: 05/13/22  1257     History  Chief Complaint  Patient presents with   Jillian Hart    KHADISHA CROSSLIN is a 86 y.o. female with Hx of diabetes mellitus, Alzheimer disease, and recurrent falls.  Presents from [facility] after reported fall.  Patient states she was walking around, tripped and fell.  Noticeable head laceration, the patient denies any head pain.  Denies any other bodily pains.  Denies shortness of breath, dizziness, lightheadedness, vision changes, chest pain, or disequilibrium before the fall.  No other complaints at this time.  Not on anticoagulation.  The history is provided by the patient and medical records.  Fall       Home Medications Prior to Admission medications   Medication Sig Start Date End Date Taking? Authorizing Provider  acetaminophen (TYLENOL) 500 MG tablet Take 500 mg by mouth 2 (two) times daily.    [provider]  aspirin EC 81 MG tablet Take 81 mg by mouth every morning.    [provider]  atorvastatin (LIPITOR) 40 MG tablet Take 40 mg by mouth daily.    [provider]  buPROPion (WELLBUTRIN XL) 150 MG 24 hr tablet Take 150 mg by mouth every morning.    [provider]  donepezil (ARICEPT) 10 MG tablet Take 10 mg by mouth at bedtime.    [provider]  insulin detemir (LEVEMIR) 100 UNIT/ML injection Inject 50 Units into the skin every morning.    [provider]  insulin lispro (HUMALOG) 100 UNIT/ML KiwkPen Inject 20 Units into the skin every evening.     [provider]  lisinopril (PRINIVIL,ZESTRIL) 10 MG tablet Take 10 mg by mouth daily.    [provider]  LORazepam (ATIVAN) 0.5 MG tablet Take 0.25 mg by mouth daily as needed for anxiety.    [provider]  ondansetron (ZOFRAN) 4 MG tablet Take 1 tablet (4 mg total) by mouth every 6 (six) hours. Patient not taking: Reported on  03/14/2015 11/29/14   Orpah Greek, MD  potassium chloride (MICRO-K) 10 MEQ CR capsule Take 10 mEq by mouth daily.    [provider]  potassium chloride SA (K-DUR,KLOR-CON) 20 MEQ tablet Take 1 tablet (20 mEq total) by mouth daily. Patient taking differently: Take 20 mEq by mouth daily. For 7 days 11/29/14   Orpah Greek, MD  rosuvastatin (CRESTOR) 20 MG tablet Take 20 mg by mouth every morning.    [provider]  traMADol (ULTRAM) 50 MG tablet Take 1 tablet (50 mg total) by mouth every 6 (six) hours as needed for pain. Patient not taking: Reported on 11/29/2014 02/25/13   Orpah Greek, MD      Allergies    Patient has no known allergies.    Review of Systems   Review of Systems  Constitutional:        Fall  HENT:         Head laceration    Physical Exam Updated Vital Signs BP 131/69   Pulse 91   Temp 98.2 F (36.8 C) (Oral)   Resp 17   Ht 5\' 5"  (1.651 m)   Wt 49.9 kg   SpO2 99%   BMI 18.30 kg/m  Physical Exam Vitals and nursing note reviewed.  Constitutional:      General: She is not in acute distress.    Appearance: She is well-developed. She is not  ill-appearing, toxic-appearing or diaphoretic.  HENT:     Head: Normocephalic.     Comments: Laceration with mild to moderate hematoma on the left aspect of the scalp.    Nose: Nose normal.     Mouth/Throat:     Pharynx: Oropharynx is clear.  Eyes:     General: Lids are normal. Vision grossly intact. Gaze aligned appropriately.     Extraocular Movements: Extraocular movements intact.     Conjunctiva/sclera: Conjunctivae normal.     Pupils: Pupils are equal, round, and reactive to light.  Neck:     Comments: Without midline tenderness Cardiovascular:     Rate and Rhythm: Normal rate and regular rhythm.     Pulses: Normal pulses.     Heart sounds: No murmur heard. Pulmonary:     Effort: Pulmonary effort is normal. No respiratory distress.     Breath sounds: Normal breath  sounds.  Chest:     Chest wall: No tenderness.  Abdominal:     Palpations: Abdomen is soft.     Tenderness: There is no abdominal tenderness.  Musculoskeletal:        General: No swelling.     Cervical back: Normal range of motion and neck supple. No rigidity or tenderness.     Comments: Pelvis stable.  No tenderness or obvious deformities of upper or lower extremities.  Skin:    General: Skin is warm and dry.     Capillary Refill: Capillary refill takes less than 2 seconds.  Neurological:     Mental Status: She is alert and oriented to person, place, and time.     GCS: GCS eye subscore is 4. GCS verbal subscore is 5. GCS motor subscore is 6.     Cranial Nerves: No dysarthria or facial asymmetry.     Sensory: No sensory deficit.     Motor: No weakness or seizure activity.     Coordination: Coordination normal.     Comments: Dementia at baseline.  Without truncal weakness or deviation.  Motor, sensation, coordination grossly intact.  At baseline per FM.  Psychiatric:        Mood and Affect: Mood normal.     ED Results / Procedures / Treatments   Labs (all labs ordered are listed, but only abnormal results are displayed) Labs Reviewed  BASIC METABOLIC PANEL - Abnormal; Notable for the following components:      Result Value   Potassium 3.4 (*)    Glucose, Bld 152 (*)    Calcium 8.8 (*)    GFR, Estimated 60 (*)    All other components within normal limits  CBC - Abnormal; Notable for the following components:   Hemoglobin 11.5 (*)    HCT 35.1 (*)    All other components within normal limits  CBG MONITORING, ED - Abnormal; Notable for the following components:   Glucose-Capillary 147 (*)    All other components within normal limits    EKG None  Radiology CT Head Wo Contrast  Result Date: 05/13/2022 CLINICAL DATA:  Head trauma, minor (Age >= 65y); Neck trauma (Age >= 65y) EXAM: CT HEAD WITHOUT CONTRAST CT CERVICAL SPINE WITHOUT CONTRAST TECHNIQUE: Multidetector CT  imaging of the head and cervical spine was performed following the standard protocol without intravenous contrast. Multiplanar CT image reconstructions of the cervical spine were also generated. RADIATION DOSE REDUCTION: This exam was performed according to the departmental dose-optimization program which includes automated exposure control, adjustment of the mA and/or kV according to patient size  and/or use of iterative reconstruction technique. COMPARISON:  02/18/2022 FINDINGS: CT HEAD FINDINGS Brain: No evidence of acute infarction, hemorrhage, hydrocephalus, extra-axial collection or mass lesion/mass effect. Chronic high right parietal lobe infarct. Extensive low-density changes within the periventricular and subcortical white matter compatible with chronic microvascular ischemic change. Moderate diffuse cerebral volume loss. Vascular: Atherosclerotic calcifications involving the large vessels of the skull base. No unexpected hyperdense vessel. Skull: Normal. Negative for fracture or focal lesion. Sinuses/Orbits: No acute finding. Other: Negative for scalp hematoma. CT CERVICAL SPINE FINDINGS Alignment: Facet joints are aligned without dislocation or traumatic listhesis. Dens and lateral masses are aligned. Straightening of the cervical lordosis. Skull base and vertebrae: No acute fracture. No primary bone lesion or focal pathologic process. Soft tissues and spinal canal: No prevertebral fluid or swelling. No visible canal hematoma. Disc levels: Multilevel intervertebral disc height loss with endplate spurring. Advanced facet joint arthropathy, more pronounced on the left. Upper chest: Included lung apices are clear. Other: Bilateral carotid atherosclerosis. IMPRESSION: 1. No acute intracranial abnormality. 2. No acute cervical spine fracture or subluxation. 3. Chronic small vessel ischemic change and chronic right parietal lobe infarct. 4. Multilevel cervical spondylosis. Electronically Signed   By: Duanne Guess D.O.   On: 05/13/2022 15:19   CT Cervical Spine Wo Contrast  Result Date: 05/13/2022 CLINICAL DATA:  Head trauma, minor (Age >= 65y); Neck trauma (Age >= 65y) EXAM: CT HEAD WITHOUT CONTRAST CT CERVICAL SPINE WITHOUT CONTRAST TECHNIQUE: Multidetector CT imaging of the head and cervical spine was performed following the standard protocol without intravenous contrast. Multiplanar CT image reconstructions of the cervical spine were also generated. RADIATION DOSE REDUCTION: This exam was performed according to the departmental dose-optimization program which includes automated exposure control, adjustment of the mA and/or kV according to patient size and/or use of iterative reconstruction technique. COMPARISON:  02/18/2022 FINDINGS: CT HEAD FINDINGS Brain: No evidence of acute infarction, hemorrhage, hydrocephalus, extra-axial collection or mass lesion/mass effect. Chronic high right parietal lobe infarct. Extensive low-density changes within the periventricular and subcortical white matter compatible with chronic microvascular ischemic change. Moderate diffuse cerebral volume loss. Vascular: Atherosclerotic calcifications involving the large vessels of the skull base. No unexpected hyperdense vessel. Skull: Normal. Negative for fracture or focal lesion. Sinuses/Orbits: No acute finding. Other: Negative for scalp hematoma. CT CERVICAL SPINE FINDINGS Alignment: Facet joints are aligned without dislocation or traumatic listhesis. Dens and lateral masses are aligned. Straightening of the cervical lordosis. Skull base and vertebrae: No acute fracture. No primary bone lesion or focal pathologic process. Soft tissues and spinal canal: No prevertebral fluid or swelling. No visible canal hematoma. Disc levels: Multilevel intervertebral disc height loss with endplate spurring. Advanced facet joint arthropathy, more pronounced on the left. Upper chest: Included lung apices are clear. Other: Bilateral carotid  atherosclerosis. IMPRESSION: 1. No acute intracranial abnormality. 2. No acute cervical spine fracture or subluxation. 3. Chronic small vessel ischemic change and chronic right parietal lobe infarct. 4. Multilevel cervical spondylosis. Electronically Signed   By: Duanne Guess D.O.   On: 05/13/2022 15:19    Procedures .Marland KitchenLaceration Repair  Date/Time: 05/13/2022 5:06 PM  Performed by: Cecil Cobbs, PA-C Authorized by: Cecil Cobbs, PA-C   Consent:    Consent obtained:  Verbal   Consent given by:  Patient   Risks discussed:  Infection, pain, need for additional repair, poor cosmetic result and poor wound healing   Alternatives discussed:  No treatment and delayed treatment Universal protocol:    Procedure explained and  questions answered to patient or proxy's satisfaction: yes     Patient identity confirmed:  Verbally with patient and arm band Anesthesia:    Anesthesia method:  Topical application   Topical anesthetic:  LET Laceration details:    Location:  Scalp   Scalp location:  L temporal   Length (cm):  1.5   Depth (mm):  2 Pre-procedure details:    Preparation:  Patient was prepped and draped in usual sterile fashion and imaging obtained to evaluate for foreign bodies Exploration:    Hemostasis achieved with:  Direct pressure and LET   Imaging obtained: x-ray     Imaging outcome: foreign body not noted     Wound exploration: wound explored through full range of motion and entire depth of wound visualized     Wound extent: no fascia violation noted, no foreign bodies/material noted, no muscle damage noted, no nerve damage noted, no tendon damage noted, no underlying fracture noted and no vascular damage noted   Treatment:    Area cleansed with:  Betadine   Amount of cleaning:  Standard   Irrigation solution:  Sterile saline   Irrigation method:  Syringe Skin repair:    Repair method:  Staples   Number of staples:  3 Approximation:    Approximation:   Close Repair type:    Repair type:  Simple Post-procedure details:    Dressing:  Open (no dressing)   Procedure completion:  Tolerated well, no immediate complications Comments:     Pressure irrigation performed.  Wound explored and base of wound visualized in a bloodless field without evidence of foreign body.  Bleeding controlled.  Tdap up-to-date.     Medications Ordered in ED Medications  lidocaine-EPINEPHrine-tetracaine (LET) topical gel (3 mLs Topical Given 05/13/22 1357)  lidocaine-EPINEPHrine-tetracaine (LET) topical gel (3 mLs Topical Given 05/13/22 1547)    ED Course/ Medical Decision Making/ A&P                           Medical Decision Making Amount and/or Complexity of Data Reviewed Labs: ordered. Radiology: ordered.   86 y.o. female presents to the ED for concern of Fall   This involves an extensive number of treatment options, and is a complaint that carries with it a high risk of complications and morbidity.  The emergent differential diagnosis prior to evaluation includes, but is not limited to: Head injury, fracture, ICH  This is not an exhaustive differential.   Past Medical History / Co-morbidities / Social History: Hx of diabetes mellitus, Alzheimer disease Social Determinants of Health include lives in assisted living facility, dementia  Additional History:  Internal and external records from outside source obtained and reviewed including prior ED visits, which indicate multiple frequent falls.  Also reviewed DNR.  Lab Tests: I ordered, and personally interpreted labs.  The pertinent results include:   CBC: Hgb 11.5, HCT 35.1 -- both stable CMP/BMP: Potassium 3.4 mild, glucose 152, calcium 8.8, GFR 60 CBG 147  Imaging Studies: I ordered imaging studies including CT head and neck.   I independently visualized and interpreted imaging which was negative for acute intracranial abnormalities, specifically: 1. No acute intracranial abnormality. 2. No  acute cervical spine fracture or subluxation.  3. Chronic small vessel ischemic change and chronic right parietal lobe infarct.  4. Multilevel cervical spondylosis I agree with the radiologist interpretation.  ED Course: Pt well-appearing on exam.  Presenting from assisted living facility due to fall yesterday, though  the wound appears to have recently slowed thus I am suspicious it occurred today.  With head laceration and mild to moderate hematoma of the left scalp.  Nontoxic, nonseptic appearing in NAD.  Awake and alert, demented at baseline.  Fall was unwitnessed, and described as mechanical in nature.  Without head pain, unstable pelvis, or other bodily pains.  No other evidence of trauma appreciated.  Without shortness of breath, dizziness, lightheadedness, chest pain before or after the fall.  Not on anticoagulation.  DNR forms present.  Labs reviewed, unremarkable.  CT imaging pending.   Normal H&H, low suspicion for acute anemia.  Vital signs normal throughout duration of encounter. Informed by nurse that patient's daughter states she was informed by the facility that the patient reported the fall occurring today, however the patient states the fall was yesterday.  Patient again demented at baseline.  Patient further described trying to get out of bed, however the floor was wet and she fell. Upon reevaluation patient status remains unchanged.  CT head and neck negative for acute fracture or bleed.  Family member at bedside, confirms pt is at baseline.  Low suspicion for ICH, SAH, or CVA at this time.  Patient hemodynamically stable.  Laceration repair as described above.  Discharged without antibiotics.  Discussed staple home care with patient and answered questions.  Patient to have staples removed in 7 days, family member acknowledges this.  Patient in NAD and in good condition at time of discharge.  Disposition: After consideration the patient's encounter today, I do not feel today's workup  suggests an emergent condition requiring admission or immediate intervention beyond what has been performed at this time.  Safe for discharge; instructed to return immediately for worsening symptoms, change in symptoms or any other concerns.  I have reviewed the patients home medicines and have made adjustments as needed.  Discussed course of treatment with the patient's family member, whom demonstrated understanding.  They are in agreement and have no further questions.     This chart was dictated using voice recognition software.  Despite best efforts to proofread, errors can occur which can change the documentation meaning.         Final Clinical Impression(s) / ED Diagnoses Final diagnoses:  Fall, initial encounter  Injury of head, initial encounter    Rx / DC Orders ED Discharge Orders     None         Candace Cruise [id number]    Noemi Chapel, MD 05/14/22 1209
